# Patient Record
Sex: Male | Born: 1985 | Race: Black or African American | Hispanic: No | Marital: Single | Smoking: Current some day smoker
Health system: Southern US, Community
[De-identification: ages and names within clinical notes are randomized; demographics above are authoritative.]

## PROBLEM LIST (undated history)

## (undated) DIAGNOSIS — Z789 Other specified health status: Secondary | ICD-10-CM

## (undated) HISTORY — PX: NO PAST SURGERIES: SHX2092

## (undated) HISTORY — PX: KNEE SURGERY: SHX244

---

## 2011-07-28 ENCOUNTER — Inpatient Hospital Stay (HOSPITAL_COMMUNITY)
Admission: EM | Admit: 2011-07-28 | Discharge: 2011-07-31 | DRG: 502 | Disposition: A | Discharge status: Home / Self Care | Payer: PRIVATE HEALTH INSURANCE | Attending: Orthopedic Surgery | Admitting: Orthopedic Surgery

## 2011-07-28 HISTORY — DX: Other specified health status: Z78.9

## 2011-07-29 ENCOUNTER — Inpatient Hospital Stay: Admit: 2011-07-29 | Payer: Self-pay | Admitting: Orthopedic Surgery

## 2011-07-29 ENCOUNTER — Encounter (HOSPITAL_COMMUNITY): Payer: Self-pay | Admitting: Radiology

## 2011-07-29 ENCOUNTER — Other Ambulatory Visit (HOSPITAL_COMMUNITY): Payer: Self-pay | Admitting: Emergency Medicine

## 2011-07-29 ENCOUNTER — Inpatient Hospital Stay (HOSPITAL_COMMUNITY): Payer: PRIVATE HEALTH INSURANCE

## 2011-07-29 ENCOUNTER — Ambulatory Visit (HOSPITAL_COMMUNITY)
Admission: RE | Admit: 2011-07-29 | Discharge: 2011-07-29 | Disposition: A | Discharge status: Home / Self Care | Payer: PRIVATE HEALTH INSURANCE | Source: Ambulatory Visit | Attending: Emergency Medicine | Admitting: Emergency Medicine

## 2011-07-29 ENCOUNTER — Encounter (HOSPITAL_COMMUNITY): Payer: Self-pay | Admitting: Anesthesiology

## 2011-07-29 ENCOUNTER — Encounter (HOSPITAL_COMMUNITY)
Admission: EM | Disposition: A | Discharge status: Home / Self Care | Payer: Self-pay | Source: Home / Self Care | Attending: Orthopedic Surgery

## 2011-07-29 ENCOUNTER — Emergency Department (HOSPITAL_COMMUNITY): Payer: PRIVATE HEALTH INSURANCE | Admitting: Anesthesiology

## 2011-07-29 DIAGNOSIS — S51859A Open bite of unspecified forearm, initial encounter: Secondary | ICD-10-CM

## 2011-07-29 DIAGNOSIS — W540XXA Bitten by dog, initial encounter: Secondary | ICD-10-CM

## 2011-07-29 DIAGNOSIS — S0180XA Unspecified open wound of other part of head, initial encounter: Secondary | ICD-10-CM | POA: Diagnosis present

## 2011-07-29 DIAGNOSIS — Y998 Other external cause status: Secondary | ICD-10-CM

## 2011-07-29 DIAGNOSIS — S51809A Unspecified open wound of unspecified forearm, initial encounter: Principal | ICD-10-CM | POA: Diagnosis present

## 2011-07-29 DIAGNOSIS — F1010 Alcohol abuse, uncomplicated: Secondary | ICD-10-CM | POA: Diagnosis present

## 2011-07-29 HISTORY — PX: I & D EXTREMITY: SHX5045

## 2011-07-29 HISTORY — PX: INCISION AND DRAINAGE OF WOUND: SHX1803

## 2011-07-29 LAB — BASIC METABOLIC PANEL
BUN: 6 mg/dL (ref 6–23)
CO2: 25 mEq/L (ref 19–32)
Calcium: 9.2 mg/dL (ref 8.4–10.5)
Chloride: 103 mEq/L (ref 96–112)
Creatinine, Ser: 0.84 mg/dL (ref 0.50–1.35)
Glucose, Bld: 94 mg/dL (ref 70–99)

## 2011-07-29 LAB — CBC
HCT: 42.2 % (ref 39.0–52.0)
MCH: 31.8 pg (ref 26.0–34.0)
MCHC: 33.4 g/dL (ref 30.0–36.0)
MCV: 95 fL (ref 78.0–100.0)
Platelets: 202 10*3/uL (ref 150–400)
RDW: 13.5 % (ref 11.5–15.5)
WBC: 12.7 10*3/uL — ABNORMAL HIGH (ref 4.0–10.5)

## 2011-07-29 LAB — DIFFERENTIAL
Basophils Absolute: 0 10*3/uL (ref 0.0–0.1)
Basophils Relative: 0 % (ref 0–1)
Eosinophils Absolute: 0.1 10*3/uL (ref 0.0–0.7)
Eosinophils Relative: 1 % (ref 0–5)
Lymphocytes Relative: 24 % (ref 12–46)
Monocytes Absolute: 0.9 10*3/uL (ref 0.1–1.0)

## 2011-07-29 LAB — ETHANOL
Alcohol, Ethyl (B): 115 mg/dL — ABNORMAL HIGH (ref 0–11)
Alcohol, Ethyl (B): 15 mg/dL — ABNORMAL HIGH (ref 0–11)

## 2011-07-29 SURGERY — IRRIGATION AND DEBRIDEMENT EXTREMITY
Anesthesia: General | Site: Face | Laterality: Left | Wound class: Dirty or Infected

## 2011-07-29 MED ORDER — METHOCARBAMOL 100 MG/ML IJ SOLN
500.0000 mg | Freq: Four times a day (QID) | INTRAVENOUS | Status: DC | PRN
  Filled 2011-07-29: qty 5

## 2011-07-29 MED ORDER — GLYCOPYRROLATE 0.2 MG/ML IJ SOLN
INTRAMUSCULAR | Status: DC | PRN
  Administered 2011-07-29: .6 mg via INTRAVENOUS

## 2011-07-29 MED ORDER — VITAMIN C 500 MG PO TABS
1000.0000 mg | ORAL_TABLET | Freq: Every day | ORAL | Status: DC
  Administered 2011-07-29 – 2011-07-31 (×3): 1000 mg via ORAL
  Filled 2011-07-29 (×3): qty 2

## 2011-07-29 MED ORDER — VECURONIUM BROMIDE 10 MG IV SOLR
INTRAVENOUS | Status: DC | PRN
  Administered 2011-07-29: 4 mg via INTRAVENOUS

## 2011-07-29 MED ORDER — DOUBLE ANTIBIOTIC 500-10000 UNIT/GM EX OINT
TOPICAL_OINTMENT | CUTANEOUS | Status: AC
  Filled 2011-07-29: qty 1

## 2011-07-29 MED ORDER — HYDROMORPHONE HCL PF 1 MG/ML IJ SOLN: 0.2500 mg | INTRAMUSCULAR | Status: DC | PRN

## 2011-07-29 MED ORDER — NEOSTIGMINE METHYLSULFATE 1 MG/ML IJ SOLN
INTRAMUSCULAR | Status: DC | PRN
  Administered 2011-07-29: 5 mg via INTRAVENOUS

## 2011-07-29 MED ORDER — ONDANSETRON HCL 4 MG/2ML IJ SOLN
INTRAMUSCULAR | Status: DC | PRN
  Administered 2011-07-29: 2 mg via INTRAVENOUS
  Administered 2011-07-29: 4 mg via INTRAVENOUS

## 2011-07-29 MED ORDER — ACETAMINOPHEN 10 MG/ML IV SOLN: 1000.0000 mg | Freq: Once | INTRAVENOUS | Status: DC | PRN

## 2011-07-29 MED ORDER — KCL IN DEXTROSE-NACL 20-5-0.9 MEQ/L-%-% IV SOLN
INTRAVENOUS | Status: DC
  Administered 2011-07-29 – 2011-07-30 (×2): via INTRAVENOUS
  Filled 2011-07-29 (×9): qty 1000

## 2011-07-29 MED ORDER — DOCUSATE SODIUM 100 MG PO CAPS
100.0000 mg | ORAL_CAPSULE | Freq: Two times a day (BID) | ORAL | Status: DC
  Administered 2011-07-29 – 2011-07-31 (×4): 100 mg via ORAL
  Filled 2011-07-29 (×4): qty 1

## 2011-07-29 MED ORDER — PROPOFOL 10 MG/ML IV EMUL
INTRAVENOUS | Status: DC | PRN
  Administered 2011-07-29: 170 mg via INTRAVENOUS

## 2011-07-29 MED ORDER — DEXAMETHASONE SODIUM PHOSPHATE 4 MG/ML IJ SOLN
INTRAMUSCULAR | Status: DC | PRN
  Administered 2011-07-29: 8 mg via INTRAVENOUS

## 2011-07-29 MED ORDER — METOCLOPRAMIDE HCL 5 MG/ML IJ SOLN
INTRAMUSCULAR | Status: DC | PRN
  Administered 2011-07-29: 10 mg via INTRAVENOUS

## 2011-07-29 MED ORDER — DIPHENHYDRAMINE HCL 25 MG PO CAPS
25.0000 mg | ORAL_CAPSULE | Freq: Four times a day (QID) | ORAL | Status: DC | PRN
  Administered 2011-07-30: 25 mg via ORAL
  Filled 2011-07-29: qty 1

## 2011-07-29 MED ORDER — IOHEXOL 300 MG/ML  SOLN
100.0000 mL | Freq: Once | INTRAMUSCULAR | Status: AC | PRN
  Administered 2011-07-29: 100 mL via INTRAVENOUS

## 2011-07-29 MED ORDER — ONDANSETRON HCL 4 MG/2ML IJ SOLN: 4.0000 mg | Freq: Once | INTRAMUSCULAR | Status: DC | PRN

## 2011-07-29 MED ORDER — OXYCODONE HCL 5 MG PO TABS
5.0000 mg | ORAL_TABLET | ORAL | Status: DC | PRN
  Administered 2011-07-30 (×3): 10 mg via ORAL
  Administered 2011-07-31: 5 mg via ORAL
  Filled 2011-07-29 (×3): qty 2
  Filled 2011-07-29: qty 1

## 2011-07-29 MED ORDER — SUCCINYLCHOLINE CHLORIDE 20 MG/ML IJ SOLN
INTRAMUSCULAR | Status: DC | PRN
  Administered 2011-07-29: 140 mg via INTRAVENOUS

## 2011-07-29 MED ORDER — MORPHINE SULFATE 2 MG/ML IJ SOLN: 1.0000 mg | INTRAMUSCULAR | Status: DC | PRN

## 2011-07-29 MED ORDER — ONDANSETRON HCL 4 MG/2ML IJ SOLN: 4.0000 mg | Freq: Four times a day (QID) | INTRAMUSCULAR | Status: DC | PRN

## 2011-07-29 MED ORDER — SODIUM CHLORIDE 0.9 % IV SOLN
3.0000 g | Freq: Four times a day (QID) | INTRAVENOUS | Status: DC
  Administered 2011-07-29 – 2011-07-31 (×10): 3 g via INTRAVENOUS
  Filled 2011-07-29 (×14): qty 3

## 2011-07-29 MED ORDER — FENTANYL CITRATE 0.05 MG/ML IJ SOLN
INTRAMUSCULAR | Status: DC | PRN
  Administered 2011-07-29 (×3): 100 ug via INTRAVENOUS

## 2011-07-29 MED ORDER — ONDANSETRON HCL 4 MG PO TABS: 4.0000 mg | ORAL_TABLET | Freq: Four times a day (QID) | ORAL | Status: DC | PRN

## 2011-07-29 MED ORDER — LACTATED RINGERS IV SOLN
INTRAVENOUS | Status: DC | PRN
  Administered 2011-07-29 (×2): via INTRAVENOUS

## 2011-07-29 MED ORDER — METHOCARBAMOL 500 MG PO TABS
500.0000 mg | ORAL_TABLET | Freq: Four times a day (QID) | ORAL | Status: DC | PRN
  Administered 2011-07-30 – 2011-07-31 (×3): 500 mg via ORAL
  Filled 2011-07-29 (×4): qty 1

## 2011-07-29 MED ORDER — HYDROCODONE-ACETAMINOPHEN 5-325 MG PO TABS
1.0000 | ORAL_TABLET | ORAL | Status: DC | PRN
  Administered 2011-07-29 – 2011-07-30 (×3): 2 via ORAL
  Filled 2011-07-29 (×4): qty 2

## 2011-07-29 SURGICAL SUPPLY — 55 items
BANDAGE CONFORM 2  STR LF (GAUZE/BANDAGES/DRESSINGS) IMPLANT
BANDAGE ELASTIC 3 VELCRO ST LF (GAUZE/BANDAGES/DRESSINGS) ×3 IMPLANT
BANDAGE ELASTIC 4 VELCRO ST LF (GAUZE/BANDAGES/DRESSINGS) ×6 IMPLANT
BANDAGE GAUZE ELAST BULKY 4 IN (GAUZE/BANDAGES/DRESSINGS) ×6 IMPLANT
BNDG COHESIVE 1X5 TAN STRL LF (GAUZE/BANDAGES/DRESSINGS) IMPLANT
BNDG ESMARK 4X9 LF (GAUZE/BANDAGES/DRESSINGS) ×3 IMPLANT
CLOTH BEACON ORANGE TIMEOUT ST (SAFETY) ×3 IMPLANT
CORDS BIPOLAR (ELECTRODE) ×3 IMPLANT
COVER MAYO STAND STRL (DRAPES) ×3 IMPLANT
COVER SURGICAL LIGHT HANDLE (MISCELLANEOUS) ×3 IMPLANT
CUFF TOURNIQUET SINGLE 18IN (TOURNIQUET CUFF) ×3 IMPLANT
CUFF TOURNIQUET SINGLE 24IN (TOURNIQUET CUFF) IMPLANT
DRAIN PENROSE 1/4X12 LTX STRL (WOUND CARE) ×3 IMPLANT
DRAPE SURG 17X23 STRL (DRAPES) ×3 IMPLANT
DRSG ADAPTIC 3X8 NADH LF (GAUZE/BANDAGES/DRESSINGS) ×3 IMPLANT
DRSG PAD ABDOMINAL 8X10 ST (GAUZE/BANDAGES/DRESSINGS) ×3 IMPLANT
ELECT REM PT RETURN 9FT ADLT (ELECTROSURGICAL)
ELECTRODE REM PT RTRN 9FT ADLT (ELECTROSURGICAL) IMPLANT
GAUZE XEROFORM 1X8 LF (GAUZE/BANDAGES/DRESSINGS) ×6 IMPLANT
GAUZE XEROFORM 5X9 LF (GAUZE/BANDAGES/DRESSINGS) ×3 IMPLANT
GLOVE BIO SURGEON STRL SZ7.5 (GLOVE) ×3 IMPLANT
GLOVE BIOGEL PI IND STRL 8.5 (GLOVE) ×2 IMPLANT
GLOVE BIOGEL PI INDICATOR 8.5 (GLOVE) ×1
GLOVE SURG ORTHO 8.0 STRL STRW (GLOVE) ×3 IMPLANT
GOWN PREVENTION PLUS XLARGE (GOWN DISPOSABLE) ×3 IMPLANT
GOWN STRL NON-REIN LRG LVL3 (GOWN DISPOSABLE) ×9 IMPLANT
HANDPIECE INTERPULSE COAX TIP (DISPOSABLE) ×1
KIT BASIN OR (CUSTOM PROCEDURE TRAY) ×3 IMPLANT
KIT ROOM TURNOVER OR (KITS) ×3 IMPLANT
MANIFOLD NEPTUNE II (INSTRUMENTS) ×3 IMPLANT
NEEDLE HYPO 25GX1X1/2 BEV (NEEDLE) IMPLANT
NS IRRIG 1000ML POUR BTL (IV SOLUTION) ×3 IMPLANT
PACK ORTHO EXTREMITY (CUSTOM PROCEDURE TRAY) ×3 IMPLANT
PAD ARMBOARD 7.5X6 YLW CONV (MISCELLANEOUS) ×6 IMPLANT
PAD CAST 4YDX4 CTTN HI CHSV (CAST SUPPLIES) ×4 IMPLANT
PADDING CAST COTTON 4X4 STRL (CAST SUPPLIES) ×2
SET HNDPC FAN SPRY TIP SCT (DISPOSABLE) ×2 IMPLANT
SOAP 2 % CHG 4 OZ (WOUND CARE) ×3 IMPLANT
SPONGE GAUZE 4X4 12PLY (GAUZE/BANDAGES/DRESSINGS) ×6 IMPLANT
SPONGE LAP 18X18 X RAY DECT (DISPOSABLE) ×3 IMPLANT
SUCTION FRAZIER TIP 10 FR DISP (SUCTIONS) ×3 IMPLANT
SUT ETHILON 4 0 PS 2 18 (SUTURE) IMPLANT
SUT ETHILON 5 0 P 3 18 (SUTURE)
SUT NYLON ETHILON 5-0 P-3 1X18 (SUTURE) IMPLANT
SUT PROLENE 3 0 PS 2 (SUTURE) ×15 IMPLANT
SUT PROLENE 4 0 PS 2 18 (SUTURE) ×3 IMPLANT
SYR CONTROL 10ML LL (SYRINGE) IMPLANT
TAPE CLOTH SURG 4X10 WHT LF (GAUZE/BANDAGES/DRESSINGS) ×3 IMPLANT
TOWEL OR 17X24 6PK STRL BLUE (TOWEL DISPOSABLE) ×9 IMPLANT
TOWEL OR 17X26 10 PK STRL BLUE (TOWEL DISPOSABLE) ×3 IMPLANT
TUBE ANAEROBIC SPECIMEN COL (MISCELLANEOUS) IMPLANT
TUBE CONNECTING 12X1/4 (SUCTIONS) ×6 IMPLANT
UNDERPAD 30X30 INCONTINENT (UNDERPADS AND DIAPERS) ×3 IMPLANT
WATER STERILE IRR 1000ML POUR (IV SOLUTION) IMPLANT
YANKAUER SUCT BULB TIP NO VENT (SUCTIONS) ×3 IMPLANT

## 2011-07-29 NOTE — Op Note (Signed)
07/29/2011  6:05 AM  PATIENT:  Darin Morris  26 y.o. male  PRE-OPERATIVE DIAGNOSIS:  Multiple Dog Bites to left face  POST-OPERATIVE DIAGNOSIS:  SAME  PROCEDURE:  Procedure(s): IRRIGATION AND DEBRIDEMENT Left Face IRRIGATION AND DEBRIDEMENT WOUND ANESTHESIA:   local and general  Surgeon: Georgia Lopes, DMD  EBL:  minimal  DRAINS: 1/4" penrose left cheek  SPECIMEN:  No Specimen  COUNTS:  YES  PLAN OF CARE: Admission Dr. Melvyn Novas  PATIENT DISPOSITION:  PACU - hemodynamically stable.   PROCEDURE DETAILS: Dictation # [id number]  Georgia Lopes, DMD 07/29/2011 6:05 AM

## 2011-07-29 NOTE — ED Notes (Signed)
See downtime charting. 

## 2011-07-29 NOTE — Progress Notes (Signed)
Patient for RN to escort him outside to smoke a cigarette with girlfriend. RN states that she is unable to do so because it is against policy, but is more than willing to calling attending on call to get an order for a nicotine patch. Patient was asked earlier today if he smoked and patient denied. Patient refused for nurse to call and get an order for a nicotine patch. RN states that patient is able to walk around the unit but would not be allowed to go outside. Will continue to monitor.   Heron Nay RN BSN

## 2011-07-29 NOTE — Anesthesia Postprocedure Evaluation (Signed)
  Anesthesia Post-op Note  Patient: Darin Morris  Procedure(s) Performed: Procedure(s) (LRB): IRRIGATION AND DEBRIDEMENT EXTREMITY (Left) IRRIGATION AND DEBRIDEMENT WOUND (Left)  Patient Location: PACU  Anesthesia Type: General  Level of Consciousness: Awake, moving all extremities, uncooperative Airway and Oxygen Therapy: Patient Spontanous Breathing and Patient connected to nasal cannula oxygen  Post-op Pain: mild  Post-op Assessment: Post-op Vital signs reviewed and Patient's Cardiovascular Status Stable  Post-op Vital Signs: Reviewed and stable  Complications: No apparent anesthesia complications

## 2011-07-29 NOTE — Progress Notes (Signed)
Patient slightly agitated, stating he needs to leave the hospital and go to work.  Pt wants to contact probation officer but cannot find phone number.  Pt's place of employment notified of hospitalization per pt request.  Pt asks about belongings (clothes and wallet).  Pt arrived to unit from PACU without any belongings.  PACU and ED called, but unable to locate any belongings.  Security notified and came to speak with patient.    Discussed with patient that leaving hospital now would be against medical advise.  Educated patient on plan of care and need for IV antibiotics.  Dr. Melvyn Novas notified and spoke with patient also.  Pt is now agreeable at this point to remain in hospital.  Pt up ambulating in halls, pain controlled, tolerating diet, voiding.  Ace splint to arm intact with shadow of drainage to posterior aspect.  Dressing to face changed d/t drainage.  Sutures and penrose drain intact.

## 2011-07-29 NOTE — H&P (Signed)
Darin Morris is an 26 y.o. male.   Chief Complaint: DOG BITE TO LEFT ARM HPI: PT BROUGHT IN BY [employer] PT WITH MULTIPLE BITES TO LEFT ARM FROM PIT BULL PT DID NOT GIVE HISTORY CONSULTED BY ED FOR HIS MAULING TO LEFT ARM  No past medical history on file.  No past surgical history on file.  No family history on file. Social History:  does not have a smoking history on file. He does not have any smokeless tobacco history on file. His alcohol and drug histories not on file.  Allergies: Allergies not on file  No prescriptions prior to admission    No results found for this or any previous visit (from the past 48 hour(s)). No results found.  UNKNOWN  There were no vitals taken for this visit. General Appearance:  Alert, cooperative, no distress, appears stated age  Head:  Normocephalic, without obvious abnormality, LEFT SIDE OF FACE AND EAR WITH OPEN WOUNDS  Eyes:  Pupils equal, conjunctiva/corneas clear,         Throat: Lips, mucosa, and tongue normal; teeth and gums normal  Neck: No visible masses     Lungs:   respirations unlabored  Chest Wall:  No tenderness or deformity  Heart:  Regular rate and rhythm,  Abdomen:   Soft, non-tender,         Extremities: LEFT ARM: MULTIPLE COMPLEX LACERATIONS ON DORSORADIAL SURFACE OF ARM AND MEDIAL ELBOW REGION NEUROMOTOR SENSORY FUNCTION DIFFICULT TO ASSESS. PT COMBATIVE IN RESTRAINTS FINGERS WARM WELL PERFUSED           Neurologic: INTOXICATED    Assessment/Plan Left arm dog mauling with multiple open complex wounds  TO OR THIS AM FOR URGENT DEBRIDEMENT AND REPAIR AS INDICATED  PT SEEN IN ED PT HAS RECEIVED MORPHINE AND APPEARS INTOXICATED, SMELL OF ALCOHOL.  EMERGENT CONSENT SIGNED DAY OF SURGERY PT SEEN AND EXAMINED PRIOR TO OPERATIVE PROCEDURE/DAY OF SURGERY SITE MARKED. WILL REMAIN AN INPATIENT FOLLOWING SURGERY  Sharma Covert 07/29/2011, 4:47 AM

## 2011-07-29 NOTE — Consult Note (Signed)
Reason for Consult: dog bite to face Referring Physician: ER  Darin Morris is an 26 y.o. male.  CC:dog bite to face, left arm  HPI: Pt pre-intubation in OR for exploration/irrigation/closure left arm lacerations.   No past medical history on file.  No past surgical history on file.  No family history on file.  Social History:  does not have a smoking history on file. He does not have any smokeless tobacco history on file. His alcohol and drug histories not on file.  Allergies: Not on File  Medications: I have reviewed the patient's current medications.  No results found for this or any previous visit (from the past 48 hour(s)).  No results found.  @ROS @ There were no vitals taken for this visit. General appearance: mild distress Head: Normocephalic, without obvious abnormality, atraumatic Eyes: negative Ears: normal TM's and external ear canals both ears Nose: Nares normal. Septum midline. Mucosa normal. No drainage or sinus tenderness. Throat: lips, mucosa, and tongue normal; teeth and gums normal Neck: no adenopathy and supple, symmetrical, trachea midline Face: 2cm laceration left cheek, 2cm stellate laceration left pre-auricular area, 1cm laceration left tragus of ear, multiple lacerations/abrasions less than 1cm left face/ cheek   Assessment/Plan: Debridement/irrigation/closure dog bite lacerations to left face.  Darin Morris M 07/29/2011, 6:00 AM

## 2011-07-29 NOTE — Transfer of Care (Signed)
Immediate Anesthesia Transfer of Care Note  Patient: Darin Morris  Procedure(s) Performed: Procedure(s) (LRB): IRRIGATION AND DEBRIDEMENT EXTREMITY (Left) IRRIGATION AND DEBRIDEMENT WOUND (Left)  Patient Location: PACU  Anesthesia Type: General  Level of Consciousness: sedated and responds to stimulation  Airway & Oxygen Therapy: Patient Spontanous Breathing and Patient connected to face mask oxygen  Post-op Assessment: Report given to PACU RN, Post -op Vital signs reviewed and stable, Patient moving all extremities and Patient moving all extremities X 4  Post vital signs: Reviewed and stable  Complications: No apparent anesthesia complications

## 2011-07-29 NOTE — Anesthesia Procedure Notes (Signed)
Procedure Name: Intubation Date/Time: 07/29/2011 5:08 AM Performed by: Wray Kearns A Pre-anesthesia Checklist: Patient identified, Timeout performed, Emergency Drugs available, Suction available and Patient being monitored Patient Re-evaluated:Patient Re-evaluated prior to inductionOxygen Delivery Method: Circle system utilized Preoxygenation: Pre-oxygenation with 100% oxygen Intubation Type: IV induction, Rapid sequence and Cricoid Pressure applied Laryngoscope Size: Mac and 4 Grade View: Grade I Tube type: Oral Tube size: 8.0 mm Number of attempts: 1 Airway Equipment and Method: Stylet Placement Confirmation: ETT inserted through vocal cords under direct vision,  breath sounds checked- equal and bilateral and positive ETCO2 Secured at: 23 cm Tube secured with: Tape Dental Injury: Teeth and Oropharynx as per pre-operative assessment

## 2011-07-29 NOTE — Brief Op Note (Signed)
07/29/2011  6:28 AM  PATIENT:  Darin Morris  26 y.o. male  PRE-OPERATIVE DIAGNOSIS:  Multiple Dog Bites  POST-OPERATIVE DIAGNOSIS:  * No post-op diagnosis entered *  PROCEDURE:  Procedure(s) (LRB): IRRIGATION AND DEBRIDEMENT EXTREMITY (Left) IRRIGATION AND DEBRIDEMENT WOUND (N/A)  SURGEON:  Surgeon(s) and Role:    * Sharma Covert, MD - Primary   PHYSICIAN ASSISTANT: none  ASSISTANTS: none   ANESTHESIA:   general  EBL:  Total I/O In: 1000 [I.V.:1000] Out: -   BLOOD ADMINISTERED:none  DRAINS: none and Penrose drain in the    LOCAL MEDICATIONS USED:  NONE  SPECIMEN:  No Specimen  DISPOSITION OF SPECIMEN:  N/A  COUNTS:  YES  TOURNIQUET:  * Missing tourniquet times found for documented tourniquets in log:  40981 *  DICTATION: .Other Dictation: Dictation Number [id number]  PLAN OF CARE: Admit to inpatient   PATIENT DISPOSITION:  PACU - hemodynamically stable.   Delay start of Pharmacological VTE agent (>24hrs) due to surgical blood loss or risk of bleeding: not applicable

## 2011-07-29 NOTE — Op Note (Signed)
NAME:  [name], [name] NO.:  [id number]  MEDICAL RECORD NO.:  [id number]  LOCATION:                                 FACILITY:  PHYSICIAN:  Georgia Lopes, M.D.  DATE OF BIRTH:  Dec 27, 1985  DATE OF PROCEDURE:  07/29/2011 DATE OF DISCHARGE:                              OPERATIVE REPORT   PREOPERATIVE DIAGNOSIS:  Multiple dog bites to the left face.  POSTOPERATIVE DIAGNOSIS:  Multiple dog bites to the left face.  PROCEDURE:  Irrigation and debridement dog bite to the left face.  SURGEON:  Georgia Lopes, M.D.  ANESTHESIA:  General, Guadalupe Maple, M.D., attending.  INDICATIONS FOR PROCEDURE:  Darin Morris is a 26 year old black male who was seen by me in the operating room immediately prior to intubation for procedure by Dr. Bradly Bienenstock to irrigate and debride the left extremity which had been severely injured during dog bite attack.  The patient also received bites to the left face.  Plan was to irrigate and debride the areas and sutures were necessary.  PROCEDURE:  The patient was placed on the table in supine position. General anesthesia was administered, and an oral endotracheal tube was placed and secured.  The eyes were protected.  The patient was draped for the debridement and irrigation by Dr. Melvyn Novas, then the drapes were rearranged so that access to the face could be obtained and the patient was rotated slightly to the right.  The patient was then draped for oral surgery.  The wound was inspected and palpated after being prepped and draped.  There were no internal foreign bodies that could be noted. Inside the lacerations, there was a 2 cm laceration medially overlying the parotid inferior to the left cheek that was the deepest of the wounds.  There was also a 2-4 cm stellate laceration in the preauricular area with the mobile tissue flap.  There were several small lacerations less than 1 cm over the left cheek and 1 cm laceration in the  tragal area of the ear without cartilage exposed.  These areas were all irrigated with 3 L normal saline cysto solution, then a quarter-inch Penrose drain was placed into the deepest of the bite injuries and sutured with 4-0 Prolene.  The remainder of the bite was closed with 4-0 Prolene to tack the areas somewhat loosely, but to reapproximate the tissue appropriately and then the wound was covered with bacitracin 4x4s and tape.  The patient remained intubated for a continuation of irrigation and debridement Dr. Melvyn Novas.     Georgia Lopes, M.D.    SMJ/MEDQ  D:  07/29/2011  T:  07/29/2011  Job:  [id number]

## 2011-07-29 NOTE — Anesthesia Preprocedure Evaluation (Signed)
Anesthesia Evaluation  Patient identified by MRN, date of birth, ID band Patient awake    Reviewed: Allergy & Precautions, H&P , NPO status , Patient's Chart, lab work & pertinent test results  Airway     Mouth opening: Limited Mouth Opening  Dental  (+) Teeth Intact   Pulmonary  breath sounds clear to auscultation        Cardiovascular Rhythm:Regular Rate:Normal     Neuro/Psych    GI/Hepatic   Endo/Other    Renal/GU      Musculoskeletal   Abdominal   Peds  Hematology   Anesthesia Other Findings Pt. uncooperative in 4 point restraints. Unable to obtain medical history from the patient.  Reproductive/Obstetrics                           Anesthesia Physical Anesthesia Plan  ASA: II and Emergent  Anesthesia Plan: General   Post-op Pain Management:    Induction: Intravenous, Cricoid pressure planned and Rapid sequence  Airway Management Planned: Oral ETT  Additional Equipment:   Intra-op Plan:   Post-operative Plan: Extubation in OR  Informed Consent: I have reviewed the patients History and Physical, chart, labs and discussed the procedure including the risks, benefits and alternatives for the proposed anesthesia with the patient or authorized representative who has indicated his/her understanding and acceptance.     Plan Discussed with: CRNA and Surgeon  Anesthesia Plan Comments:         Anesthesia Quick Evaluation

## 2011-07-29 NOTE — Op Note (Signed)
NAME:  Darin Morris, Darin Morris         ACCOUNT NO.:  [id number]  MEDICAL RECORD NO.:  [id number]  LOCATION:                                 FACILITY:  PHYSICIAN:  Madelynn Done, MD  DATE OF BIRTH:  1985/07/27  DATE OF PROCEDURE:  07/29/2011 DATE OF DISCHARGE:                              OPERATIVE REPORT   PREOPERATIVE DIAGNOSES:  Multiple dog bites, mangling injury to the left forearm and elbow from a pit bull attack.  POSTOPERATIVE DIAGNOSES:  Multiple dog bites, mangling injury to the left forearm and elbow from a pit bull attack.  ANESTHESIA:  General via endotracheal tube.  SURGICAL PROCEDURES: 1. Left forearm irrigation and debridement, 5-cm dog bite excisional     debridement over the dorsal radial aspect of the forearm. 2. Debridement of skin, subcutaneous tissue, and muscle, 6-cm     laceration over the dorsal aspect of the forearm. 3. Debridement of skin, subcutaneous tissue, muscle, and tendon, 15-cm     wound medial aspect of the forearm and elbow. 4. Median nerve neurolysis and exploration. 5. Debridement of the flexor pronator mass fascia as well as partial     tendon of the distal biceps. 6. Traumatic laceration repair 5 cm 7. Traumatic laceration repair 6 cm 8. Traumatic laceration repair 15 cm  SURGICAL INDICATIONS:  Mr. Labrie is a right-hand-dominant gentleman, who sustained a pit bull attack.  The patient was seen in the emergency room and given the extensive injuries, it was recommended that he undergo the above procedure.  The procedure was discussed with the patient, and emergent consent was obtained.  The patient had been sedated, appeared intoxicated, and it was felt necessary given the extensive wound to take him on an emergent basis.  DESCRIPTION OF PROCEDURE:  The patient was properly identified in the preop holding area and a mark with a permanent marker was made on the left arm to indicate the correct operative site.  The patient was  then brought back to the operating room and placed supine on the anesthesia room table.  General anesthesia was administered.  The patient received Zosyn.  The well-padded tourniquet was then placed on the left brachium and sealed with a 1000 drape.  Left upper extremity was then prepped and draped in normal sterile fashion.  Time-out was called, correct site was identified, and the procedure was then begun.  Attention was then turned to the left arm where the patient did have a 15-cm laceration, a 5-cm laceration, and a 6-cm laceration.  All these were then separately debrided.  Excisional debridement was then carried out of the skin, subcutaneous tissue, and muscle with sharp knives and scissors and after excisional debridement and removing the devitalized tissue, the medial wound was focused on.  This underwent excisional debridement.  The excision of the muscle and tendon of the flexor pronator mass was then carried out as well as portion of the biceps tendon.  The brachial artery and median nerve were then carefully explored.  Careful x-rays of the brachial artery revealed the artery to be intact as well as the median nerve.  Copious wound irrigation done throughout.  Following this, after median nerve neurolysis, the wounds were then irrigated  once again.  Following this, the traumatic lacerations were then loosely reapproximated with 3-0 Prolene sutures.  Partial closure of the wounds was then carried out to allow the wounds to drain.  The Adaptic dressings were then applied.  Sterile compressive bandage was then applied.  The patient was then placed in Webril, Kerlix, and a long arm splint.  The patient had good refill and good blood flow to the hand.  The patient was then extubated and taken to recovery room in good condition.  POSTPROCEDURE PLAN:  The patient will be admitted to the orthopedic floor for IV antibiotics and pain control.  The wounds will be examined and may  likely require repeat I and D at the 48-hour postop mark.     Madelynn Done, MD     FWO/MEDQ  D:  07/29/2011  T:  07/29/2011  Job:  [id number]

## 2011-07-30 ENCOUNTER — Encounter (HOSPITAL_COMMUNITY): Payer: Self-pay | Admitting: Orthopedic Surgery

## 2011-07-30 MED FILL — Midazolam HCl Inj 2 MG/2ML (Base Equivalent): INTRAMUSCULAR | Qty: 4 | Status: AC

## 2011-07-30 MED FILL — Piperacillin Sod-Tazobactam Na For Inj 3.375 GM (3-0.375 GM): INTRAVENOUS | Qty: 3.38 | Status: AC

## 2011-07-30 MED FILL — Morphine Sulfate IV Soln 25 MG/ML: INTRAVENOUS | Qty: 10 | Status: AC

## 2011-07-30 MED FILL — Ondansetron HCl Inj 4 MG/2ML (2 MG/ML): INTRAMUSCULAR | Qty: 2 | Status: AC

## 2011-07-30 NOTE — Progress Notes (Signed)
Physical Therapy Screen  Orders received, chart reviewed, spoke with RN;   Pt ambulating in hallways independently;   No PT needs identified -- Will defer to OT for therapy needs;  Signing off, Thanks,  [place], [place] [phone]

## 2011-07-30 NOTE — Evaluation (Signed)
Occupational Therapy Evaluation Patient Details Name: Darin Morris MRN: [id number] DOB: 08-Oct-1985 Today's Date: 07/30/2011 Time: 2595-6387 OT Time Calculation (min): 12 min  OT Assessment / Plan / Recommendation Clinical Impression  This 26 year old man sustained LUE and facial injuries from a pitbull. He needs min A for UB ADLs due to LUE fully extended in splint.  He is independent with all other adls and verbalizes understanding of edema management.  No further OT needs nor DME are identified at this time.      OT Assessment  Patient does not need any further OT services    Follow Up Recommendations  No OT follow up    Barriers to Discharge      Equipment Recommendations  None recommended by OT    Recommendations for Other Services    Frequency       Precautions / Restrictions Precautions Precautions:  (elevate for edema) Restrictions Other Position/Activity Restrictions: I & D to LUE--elevate   Pertinent Vitals/Pain No pain reported   ADL  Upper Body Bathing: Simulated;Minimal assistance Where Assessed - Upper Body Bathing: Unsupported sitting Lower Body Bathing: Independent;Simulated Where Assessed - Lower Body Bathing: Unsupported sit to stand Upper Body Dressing: Performed;Minimal assistance Where Assessed - Upper Body Dressing: Unsupported sitting Lower Body Dressing: Performed;Independent Where Assessed - Lower Body Dressing: Unsupported sit to stand Toilet Transfer: Simulated;Independent Toileting - Clothing Manipulation and Hygiene: Simulated;Independent Transfers/Ambulation Related to ADLs: pt walking around room independently ADL Comments: Pt needs min A with UB ADLS:  LUE is splinted in extension.  Educated on modifications as well as edema managment.  Girlfriend in room and will help pt.      OT Diagnosis:    OT Problem List:   OT Treatment Interventions:     OT Goals    Visit Information  Last OT Received On: 07/30/11    Subjective Data  Subjective: "Can I wear jeans and a tshirt"   Prior Functioning  Vision/Perception  Home Living Additional Comments: girlfriend in room and will help prn Prior Function Level of Independence: Independent      Cognition  Overall Cognitive Status: Appears within functional limits for tasks assessed/performed Behavior During Session: [facility] for tasks performed    Extremity/Trunk Assessment     Mobility     Exercise    Balance    End of Session OT - End of Session Activity Tolerance: Patient tolerated treatment well Patient left: with family/visitor present;with call bell/phone within reach (eob)  GO     [name] 07/30/2011, 12:28 PM Marica Otter, OTR/L [phone] 07/30/2011

## 2011-07-30 NOTE — Progress Notes (Signed)
Met with patient and his girlfriend Darin Morris this morning. Patient was very upset about multiple issues and CSW provided reassurance and support.  Patient is very anxious to talk to his surgeon about his condition, surgery and possible need for further surgery.  Patient states that he is very anxious and had nursing concerns which have been addressed by the assistant unit director. Patient states that he lives in [place] with his girlfriend Darin Morris. He works for [employer] and will start having benefits in August. He is very worried about his job.  He is also in probation and requested that CSW notify his probation officer of his admission as "proof" of his hospitalization. He also attends a program called "[employer]" and he is upset that he missed his group on Monday and will miss Wednesday's meeting.  Per his request- CSW left a message for [name]- Coordinator  [phone]  Ext [phone]  regarding patient being in the hospital. This helped to reassure patient somewhat.  Nursing is aware of above and are attempting to reach the surgeon or PA regarding patient's concerns.  No further CSW need identified.  CSW will sign off but will be available if needed.  [name]. [place]  [phone]

## 2011-07-30 NOTE — Progress Notes (Signed)
POD #1  PT SEEN EXAMINED PT REASSURED ABOUT FINDINGS OF ARM PLAN TO LOOK AT WOUNDS TOMORROW IF LOOKS OK COULD GO HOME PT MAY NEED REPEAT I/D PT VOICED UNDERSTANDING OF PLAN

## 2011-07-31 LAB — POCT I-STAT 4, (NA,K, GLUC, HGB,HCT)
HCT: 42 % (ref 39.0–52.0)
Potassium: 3.8 mEq/L (ref 3.5–5.1)
Sodium: 140 mEq/L (ref 135–145)

## 2011-07-31 MED ORDER — BACITRACIN-NEOMYCIN-POLYMYXIN OINTMENT TUBE
TOPICAL_OINTMENT | Freq: Every day | CUTANEOUS | Status: DC
  Filled 2011-07-31: qty 15

## 2011-07-31 MED ORDER — OXYCODONE-ACETAMINOPHEN 10-325 MG PO TABS: 1.0000 | ORAL_TABLET | ORAL | Status: AC | PRN

## 2011-07-31 MED ORDER — AMOXICILLIN-POT CLAVULANATE 875-125 MG PO TABS: 1.0000 | ORAL_TABLET | Freq: Two times a day (BID) | ORAL | Status: AC

## 2011-07-31 NOTE — Progress Notes (Signed)
Dr. Orlan Leavens aware that pt did not receive rabies vaccine in ED stated he was not going to order one it was between pharmacy and the ED and that the pt needed to go back to ED for vaccine.

## 2011-07-31 NOTE — Progress Notes (Signed)
Utilization review completed. Anette Guarneri, RN, BSN.  07/30/11

## 2011-07-31 NOTE — Discharge Summary (Signed)
Physician Discharge Summary  Patient ID: Darin Morris MRN: [id number] DOB/AGE: 24-02-1985 24 y.o.  Admit date: 07/29/2011 Discharge date: 07/31/2011  Admission Diagnoses: Multiple Dog Bites Past Medical History  Diagnosis Date  . No pertinent past medical history     Discharge Diagnoses:  Dog bite left arm  Surgeries: Procedure(s): IRRIGATION AND DEBRIDEMENT EXTREMITY IRRIGATION AND DEBRIDEMENT WOUND on 07/29/2011    Consultants:  none  Discharged Condition: Improved  Hospital Course: Darin Morris is an 26 y.o. male who was admitted 07/29/2011 with a chief complaint of No chief complaint on file. , and found to have a diagnosis of Multiple Dog Bites.  They were brought to the operating room on 07/29/2011 and underwent Procedure(s): IRRIGATION AND DEBRIDEMENT EXTREMITY IRRIGATION AND DEBRIDEMENT WOUND.    They were given perioperative antibiotics: Anti-infectives     Start     Dose/Rate Route Frequency Ordered Stop   07/31/11 0000   amoxicillin-clavulanate (AUGMENTIN) 875-125 MG per tablet        1 tablet Oral 2 times daily 07/31/11 1719 08/10/11 2359   07/29/11 0900   Ampicillin-Sulbactam (UNASYN) 3 g in sodium chloride 0.9 % 100 mL IVPB        3 g 100 mL/hr over 60 Minutes Intravenous Every 6 hours 07/29/11 0735          .  They were given sequential compression devices, early ambulation, and Other (comment) AMBULATION for DVT prophylaxis.  Recent vital signs: Patient Vitals for the past 24 hrs:  BP Temp Temp src Pulse Resp SpO2  07/31/11 0546 121/70 mmHg 98.9 F (37.2 C) Oral 59  18  100 %  07/30/11 2320 131/65 mmHg 99.3 F (37.4 C) - 74  - 100 %  .  Recent laboratory studies: No results found.  Discharge Medications:   Medication List  As of 07/31/2011  5:20 PM   TAKE these medications         amoxicillin-clavulanate 875-125 MG per tablet   Commonly known as: AUGMENTIN   Take 1 tablet by mouth 2 (two) times daily.     oxyCODONE-acetaminophen 10-325 MG per tablet   Commonly known as: PERCOCET   Take 1 tablet by mouth every 4 (four) hours as needed for pain.            Diagnostic Studies: Dg Elbow 2 Views Left  07/29/2011  *RADIOLOGY REPORT*  Clinical Data: Dog bite and soft tissue injury to the left elbow region.  LEFT ELBOW - TWO VIEWS  Comparison:  None.  Findings: Extensive soft tissue injuries are identified centered around the left elbow with visible soft tissue gas present dissecting in the fascial planes of the upper arm.  No underlying fracture or dislocation. Two tiny densities are seen along lateral and medial aspects of the proximal forearm which may be within the wounds or on the skin.  IMPRESSION: No acute fracture.  Extensive soft tissue injuries with soft tissue gas present around the elbow and extending superiorly into the upper arm.  Original Report Authenticated By: Reola Calkins, M.D.   Ct Soft Tissue Neck W Contrast  07/29/2011  *RADIOLOGY REPORT*  Clinical Data:  DOG BITE  CT NECK WITH CONTRAST  Technique:  Multidetector CT imaging of the neck was performed using the standard protocol following the bolus administration of intravenous contrast.  Contrast: OMNIPAQUE IOHEXOL 300 MG/ML  SOLN 100 ml Omnipaque  Comparison:   None  Comparison:   None.  Findings:  There is soft tissue injury to  the left neck in the region of the angle of the jaw.  There is gas dissecting through the muscles of mastication from the level hyoid bone superior to the parotid gland.  There is swelling of the muscles of mastication.  There is no evidence of discrete vascular injury. The submandibular glands and parotid glands are intact.  There is some disruption of the inferior tail of the parotid gland on the left.  There is a deep, calcific density measuring 3 mm at the most medial inferior aspect of the injury (image 64).  This is in the course of the styloid process on the left.  Cannot exclude a foreign body  from the injury.  This is essentially within the parapharyngeal space.  There is no discrete evidence of vascular injury.  Airway is intact.  The glottis is normal.  Exam extends to the thoracic inlet.  There is no evidence of pneumothorax or vascular injury to the thoracic inlet.  IMPRESSION:    1.  Extensive soft tissue injury to the left muscles of mastication and parotid gland on the left.  There is gas dissecting along the planes of the injury.  2.  Injury to the tail the parotid gland.  3.  Question small foreign body in the parapharyngeal space inferior to the styloid process of the left.  4.  No clear evidence of arterial vascular injury.  Original Report Authenticated By: Genevive Bi, M.D.   Ct Maxillofacial Wo Cm  07/29/2011  *RADIOLOGY REPORT*  Clinical Data:  Dog bite  CT MAXILLOFACIAL WITH CONTRAST  Technique:  Multidetector CT imaging of the maxillofacial structure s was performed with intravenous contrast. Multiplanar CT image reconstructions were als o generated.  A small metallic BB was placed on the right temple in order to reliably differentiate right from left.  Contrast: OMNIPAQUE IOHEXOL 300 MG/ML  SOLN 100 ml Omnipaque  Comparison:   None.  Findings:  There is soft tissue injury to the left neck in the region of the angle of the jaw.  There is gas dissecting through the muscles of mastication from the level hyoid bone superior to the parotid gland.  There is swelling of the muscles of mastication.  There is no evidence of discrete vascular injury. The submandibular glands and parotid glands are intact.  There is some disruption of the inferior tail of the parotid gland on the left.  There is a deep, calcific density measuring 3 mm at the most medial inferior aspect of the injury (image 64).  This is in the course of the styloid process on the left.  Cannot exclude a foreign body from the injury.  This is essentially within the parapharyngeal space.  There is no discrete evidence  of vascular injury.  Airway is intact.  The glottis is normal.  Exam extends to the thoracic inlet.  There is no evidence of pneumothorax or vascular injury to the thoracic inlet.  IMPRESSION:  1.  Extensive soft tissue injury to the left muscles of mastication and parotid gland on the left.  There is gas dissecting along the planes of the injury. 2.  Injury to the tail of the parotid gland. 3.  Question small foreign body in the parapharyngeal space inferior to the styloid process of the left.  4.  No clear evidence of arterial vascular injury.  Original Report Authenticated By: Genevive Bi, M.D.    They benefited maximally from their hospital stay and there were no complications.     Disposition:  Final discharge disposition not confirmed  Follow-up Information    Follow up with Sharma Covert, MD. Schedule an appointment as soon as possible for a visit in 5 days.   Contact information:   [facility] [address] [place] [address] [phone]        PT SEEN/EXAMINED ON DAY OF DISCHARGE WOUNDS LOOKED GOOD FELT READY TO GO HOME CLOSE F/U IN FIVE DAYS .   SignedSharma Covert 07/31/2011, 5:20 PM

## 2011-08-06 LAB — POCT I-STAT, CHEM 8
BUN: 20 mg/dL (ref 6–23)
Creatinine, Ser: 1 mg/dL (ref 0.50–1.35)
Glucose, Bld: 129 mg/dL — ABNORMAL HIGH (ref 70–99)
Sodium: 140 mEq/L (ref 135–145)
TCO2: 20 mmol/L (ref 0–100)

## 2012-06-18 ENCOUNTER — Encounter (HOSPITAL_COMMUNITY): Payer: Self-pay

## 2012-06-18 ENCOUNTER — Emergency Department (INDEPENDENT_AMBULATORY_CARE_PROVIDER_SITE_OTHER)
Admission: EM | Admit: 2012-06-18 | Discharge: 2012-06-18 | Disposition: A | Discharge status: Home / Self Care | Payer: Self-pay | Source: Home / Self Care

## 2012-06-18 DIAGNOSIS — S0181XA Laceration without foreign body of other part of head, initial encounter: Secondary | ICD-10-CM

## 2012-06-18 DIAGNOSIS — S0180XA Unspecified open wound of other part of head, initial encounter: Secondary | ICD-10-CM

## 2012-06-18 DIAGNOSIS — Z23 Encounter for immunization: Secondary | ICD-10-CM

## 2012-06-18 MED ORDER — TETANUS-DIPHTH-ACELL PERTUSSIS 5-2.5-18.5 LF-MCG/0.5 IM SUSP
INTRAMUSCULAR | Status: AC
  Filled 2012-06-18: qty 0.5

## 2012-06-18 MED ORDER — TETANUS-DIPHTH-ACELL PERTUSSIS 5-2.5-18.5 LF-MCG/0.5 IM SUSP
0.5000 mL | Freq: Once | INTRAMUSCULAR | Status: AC
  Administered 2012-06-18: 0.5 mL via INTRAMUSCULAR

## 2012-06-18 NOTE — ED Provider Notes (Signed)
History     CSN: [id number]  Arrival date & time 06/18/12  1023   First MD Initiated Contact with Patient 06/18/12 1142      Chief Complaint  Patient presents with  . Fall    (Consider location/radiation/quality/duration/timing/severity/associated sxs/prior treatment) HPI Comments: 27 year old male was at home last night and this morning and awake very late including early this morning. He was chasing the cat, tripped, fell and struck his right side of his fore head on the corner of the table. He is unsure as to what time that was. He states he remembers the fall and did not lose consciousness. Shortly after he went to bed and fell asleep. He awoke this morning and noticed the laceration that he applied a bandage to previously. He presents today with a 4 cm deep gaping laceration over the right eyebrow and glabella. He denies any known loss of consciousness. He remembers the events and denies other injury. Denies injury to the neck, back, chest, abdomen or other extremities. His speech is clear, he is alert and oriented moves all extremities and denies focal paresthesias or weakness. He had been consuming alcohol last night. His only complaint is that he feels sleepy in part because he had been drinking last night and that he did not get much sleep because he was awake to light.   Past Medical History  Diagnosis Date  . No pertinent past medical history     Past Surgical History  Procedure Laterality Date  . No past surgeries    . I&d extremity  07/29/2011    Procedure: IRRIGATION AND DEBRIDEMENT EXTREMITY;  Surgeon: Sharma Covert, MD;  Location: [facility] OR;  Service: Orthopedics;  Laterality: Left;  . Incision and drainage of wound  07/29/2011    Procedure: IRRIGATION AND DEBRIDEMENT WOUND;  Surgeon: Sharma Covert, MD;  Location: [facility] OR;  Service: Orthopedics;  Laterality: Left;    History reviewed. No pertinent family history.  History  Substance Use Topics  . Smoking status: Former  Smoker -- 4 years    Types: Cigarettes    Quit date: 04/08/2011  . Smokeless tobacco: Never Used  . Alcohol Use: 6.0 oz/week    6 Cans of beer, 4 Shots of liquor per week      Review of Systems  Constitutional: Negative.  Negative for fever, diaphoresis, appetite change and fatigue.  HENT: Negative for ear pain, congestion, sore throat, facial swelling, trouble swallowing, neck pain, neck stiffness, dental problem and ear discharge.   Respiratory: Negative.  Negative for cough and shortness of breath.   Cardiovascular: Negative for chest pain and leg swelling.  Gastrointestinal: Negative.   Genitourinary: Negative.   Musculoskeletal: Negative for myalgias, back pain, joint swelling and gait problem.  Skin: Positive for wound.  Neurological: Negative for dizziness, weakness, numbness and headaches.  Psychiatric/Behavioral: Negative for suicidal ideas, behavioral problems, decreased concentration and agitation. The patient is not hyperactive.     Allergies  Review of patient's allergies indicates no known allergies.  Home Medications  No current outpatient prescriptions on file.  BP 130/66  Pulse 58  Temp(Src) 98.4 F (36.9 C) (Oral)  Resp 16  SpO2 100%  Physical Exam  Nursing note and vitals reviewed. Constitutional: He is oriented to person, place, and time. He appears well-developed and well-nourished. No distress.  HENT:  Bilateral TMs are normal without hemotympanum Oropharynx is clear without swelling or evidence of trauma.  Eyes: Conjunctivae and EOM are normal. Pupils are equal, round, and  reactive to light.  Neck: Normal range of motion. Neck supple.  Cardiovascular: Normal rate, regular rhythm and normal heart sounds.   Pulmonary/Chest: Breath sounds normal. No respiratory distress.  Musculoskeletal: Normal range of motion. He exhibits no edema and no tenderness.  Lymphadenopathy:    He has no cervical adenopathy.  Neurological: He is alert and oriented to  person, place, and time. No cranial nerve deficit. He exhibits normal muscle tone.  Skin: Skin is warm and dry.  There is an approximately 4 cm laceration across the right forehead at the level of the right brow extending into the glabella. It does not involve the nose or the eyelid.    ED Course  LACERATION REPAIR Date/Time: 06/18/2012 1:00 PM Performed by: Phineas Real, Khayri Kargbo Authorized by: Maryelizabeth Rowan Consent: Verbal consent obtained. Risks and benefits: risks, benefits and alternatives were discussed Consent given by: patient Patient understanding: patient states understanding of the procedure being performed Patient identity confirmed: verbally with patient Body area: head/neck Location details: right eyebrow Laceration length: 4 cm Tendon involvement: none Nerve involvement: none Vascular damage: no Anesthesia: local infiltration Local anesthetic: lidocaine 2% with epinephrine Anesthetic total: 4 ml Patient sedated: no Preparation: Patient was prepped and draped in the usual sterile fashion. Irrigation solution: saline Irrigation method: syringe Amount of cleaning: standard Debridement: none Degree of undermining: none Skin closure: 4-0 nylon and 5-0 nylon Number of sutures: 8 Technique: simple Approximation: close Approximation difficulty: simple Dressing: antibiotic ointment Comments: Suture needle placed deep to close the SQ layer as well as the dermis   (including critical care time)  Labs Reviewed - No data to display No results found.   1. Laceration of forehead without complication, initial encounter       MDM  1145H pt in bathroom 1154h  Pt in bathroom Wound closure with sutures. Injury instructions were given in detail verbally and written. He will be going home with a friend and will stay with a friend all day. He will be out of work today. He will be given a TDap if cannot specifically remember a tetanus injection in the past 5 years. Suture wound  care instructions given. He should return in 6 days to have the sutures removed. For any new symptoms, problems, worsening, signs of head injury or other problems her to the emergency department. Otherwise return to the urgent care.        Hayden Rasmussen, NP 06/18/12 1333

## 2012-06-18 NOTE — ED Notes (Signed)
Set up suture tray for provider

## 2012-06-18 NOTE — ED Notes (Signed)
Presents w laceration to right eyebrow area, patient unsure cause, but admits to consuming ETOH last PM. Unsure of LOC . Laceration deep, aprox 3-4 cm, bleeding controlled. Denies other injury C/o HA

## 2012-06-26 ENCOUNTER — Emergency Department (INDEPENDENT_AMBULATORY_CARE_PROVIDER_SITE_OTHER)
Admission: EM | Admit: 2012-06-26 | Discharge: 2012-06-26 | Disposition: A | Discharge status: Home / Self Care | Payer: Self-pay | Source: Home / Self Care

## 2012-06-26 ENCOUNTER — Encounter (HOSPITAL_COMMUNITY): Payer: Self-pay | Admitting: *Deleted

## 2012-06-26 DIAGNOSIS — Z4802 Encounter for removal of sutures: Secondary | ICD-10-CM

## 2012-06-26 NOTE — ED Provider Notes (Signed)
History     CSN: [id number]  Arrival date & time 06/26/12  1231   None     Chief Complaint  Patient presents with  . Suture / Staple Removal    (Consider location/radiation/quality/duration/timing/severity/associated sxs/prior treatment) HPI Comments: For suture removal of laceration over R brow and glabella   Past Medical History  Diagnosis Date  . No pertinent past medical history     Past Surgical History  Procedure Laterality Date  . No past surgeries    . I&d extremity  07/29/2011    Procedure: IRRIGATION AND DEBRIDEMENT EXTREMITY;  Surgeon: Sharma Covert, MD;  Location: [facility] OR;  Service: Orthopedics;  Laterality: Left;  . Incision and drainage of wound  07/29/2011    Procedure: IRRIGATION AND DEBRIDEMENT WOUND;  Surgeon: Sharma Covert, MD;  Location: [facility] OR;  Service: Orthopedics;  Laterality: Left;    No family history on file.  History  Substance Use Topics  . Smoking status: Former Smoker -- 4 years    Types: Cigarettes    Quit date: 04/08/2011  . Smokeless tobacco: Never Used  . Alcohol Use: 6.0 oz/week    6 Cans of beer, 4 Shots of liquor per week      Review of Systems  All other systems reviewed and are negative.    Allergies  Review of patient's allergies indicates no known allergies.  Home Medications  No current outpatient prescriptions on file.  BP 120/70  Pulse 72  Temp(Src) 98.6 F (37 C) (Oral)  Resp 16  SpO2 100%  Physical Exam  Constitutional: He is oriented to person, place, and time.  Pulmonary/Chest: Effort normal.  Neurological: He is alert and oriented to person, place, and time.  Skin: Skin is warm and dry. No rash noted.  Wound healing well, no signs of infection.  Psychiatric: He has a normal mood and affect.    ED Course  Procedures (including critical care time)  Labs Reviewed - No data to display No results found.   1. Visit for suture removal       MDM  Keep wound dry, watch for infection Scar  minimization instructions All sutures removed        Hayden Rasmussen, NP 06/26/12 1301

## 2012-06-26 NOTE — ED Notes (Signed)
Pt  Here  For  Suture  Removal   Sutures  In place  For  8  Days

## 2012-06-26 NOTE — ED Provider Notes (Signed)
Medical screening examination/treatment/procedure(s) were performed by non-physician practitioner and as supervising physician I was immediately available for consultation/collaboration.   MORENO-COLL,Hania Cerone; MD  Drayson Dorko Moreno-Coll, MD 06/26/12 1624 

## 2012-07-31 ENCOUNTER — Emergency Department (HOSPITAL_COMMUNITY): Payer: Medicaid - Out of State

## 2012-07-31 ENCOUNTER — Encounter (HOSPITAL_COMMUNITY): Payer: Self-pay

## 2012-07-31 ENCOUNTER — Emergency Department (HOSPITAL_COMMUNITY)
Admission: EM | Admit: 2012-07-31 | Discharge: 2012-07-31 | Discharge status: Against Medical Advice | Payer: Medicaid - Out of State | Source: Home / Self Care | Attending: Emergency Medicine | Admitting: Emergency Medicine

## 2012-07-31 ENCOUNTER — Emergency Department (HOSPITAL_COMMUNITY)
Admission: EM | Admit: 2012-07-31 | Discharge: 2012-07-31 | Disposition: A | Discharge status: Home / Self Care | Payer: Medicaid - Out of State | Attending: Emergency Medicine | Admitting: Emergency Medicine

## 2012-07-31 DIAGNOSIS — Y929 Unspecified place or not applicable: Secondary | ICD-10-CM | POA: Insufficient documentation

## 2012-07-31 DIAGNOSIS — Y9389 Activity, other specified: Secondary | ICD-10-CM | POA: Insufficient documentation

## 2012-07-31 DIAGNOSIS — R5381 Other malaise: Secondary | ICD-10-CM | POA: Insufficient documentation

## 2012-07-31 DIAGNOSIS — Z87891 Personal history of nicotine dependence: Secondary | ICD-10-CM | POA: Insufficient documentation

## 2012-07-31 DIAGNOSIS — IMO0002 Reserved for concepts with insufficient information to code with codable children: Secondary | ICD-10-CM

## 2012-07-31 DIAGNOSIS — S61409A Unspecified open wound of unspecified hand, initial encounter: Secondary | ICD-10-CM | POA: Insufficient documentation

## 2012-07-31 DIAGNOSIS — R209 Unspecified disturbances of skin sensation: Secondary | ICD-10-CM | POA: Insufficient documentation

## 2012-07-31 DIAGNOSIS — S61411A Laceration without foreign body of right hand, initial encounter: Secondary | ICD-10-CM

## 2012-07-31 DIAGNOSIS — W172XXA Fall into hole, initial encounter: Secondary | ICD-10-CM | POA: Insufficient documentation

## 2012-07-31 DIAGNOSIS — W268XXA Contact with other sharp object(s), not elsewhere classified, initial encounter: Secondary | ICD-10-CM | POA: Insufficient documentation

## 2012-07-31 MED ORDER — HYDROCODONE-ACETAMINOPHEN 5-325 MG PO TABS: 2.0000 | ORAL_TABLET | Freq: Four times a day (QID) | ORAL | Status: DC | PRN

## 2012-07-31 MED ORDER — PROMETHAZINE HCL 25 MG PO TABS: 25.0000 mg | ORAL_TABLET | Freq: Four times a day (QID) | ORAL | Status: DC | PRN

## 2012-07-31 NOTE — ED Notes (Signed)
Pt came out of room yelling at RN that he needed something to drink- pt told that he could have something as soon as the doctor assessed him.  Pt became loud and cursing at desk - [employer] at nurses station.  When radiology transport sent for pt- pt became upset and states "yall are all stuck up".  Pt escorted back into room by [employer].

## 2012-07-31 NOTE — ED Notes (Signed)
Pt became belligerent with staff and started cursing and yelling "fuck everybody in here".  Pt escorted off by [employer].  Pt left ama and refused care.

## 2012-07-31 NOTE — ED Provider Notes (Signed)
Had briefly gone into room to check patient w laceration to palmar aspect right hand, dressing intact, no active bleeding. xrays pending.  Discussed plan w patient or xr,  Complete  exam, possible hand consult.  Went to recheck, staff indicates patient had left ED AMA prior to completion of his evaluation and treatment.   Suzi Roots, MD 07/31/12 [phone]

## 2012-07-31 NOTE — ED Provider Notes (Signed)
CSN: [id number]     Arrival date & time 07/31/12  1440 History    This chart was scribed for a non-physician practitioner, Roxy Horseman, PA-C, working with Rolan Bucco, MD by Frederik Pear, ED Scribe. This patient was seen in room WTR8/WTR8 and the patient's care was started at 1503.   First MD Initiated Contact with Patient 07/31/12 1503     Chief Complaint  Patient presents with  . Laceration   (Consider location/radiation/quality/duration/timing/severity/associated sxs/prior Treatment) The history is provided by the patient and medical records. No language interpreter was used.    HPI Comments: Darin Morris is a 27 y.o. male brought in by ambulance who presents to the Emergency Department complaining of a left hand laceration with associated numbness in his third, fourth, and fifth digits of the left hand that began 1 hour ago after he fell on an L-shaped piece of metal while attempting to fix his air conditioner. His significant other reports he seems more disoriented than usual in the ED and is concerned it is due to the amount of blood that he initially lost. She reports immediately after the accident that the wound was spraying and gushing blood which was alleviated by reducing the amount of movement he was making. She quantified the amount of blood lost as a "pint." He denies treatment at home, but EMS applied a bandage in route to the ED. His last tetanus was 2 months ago.    Past Medical History  Diagnosis Date  . No pertinent past medical history    Past Surgical History  Procedure Laterality Date  . No past surgeries    . I&d extremity  07/29/2011    Procedure: IRRIGATION AND DEBRIDEMENT EXTREMITY;  Surgeon: Sharma Covert, MD;  Location: [facility] OR;  Service: Orthopedics;  Laterality: Left;  . Incision and drainage of wound  07/29/2011    Procedure: IRRIGATION AND DEBRIDEMENT WOUND;  Surgeon: Sharma Covert, MD;  Location: [facility] OR;  Service: Orthopedics;  Laterality: Left;    No family history on file. History  Substance Use Topics  . Smoking status: Former Smoker -- 4 years    Types: Cigarettes    Quit date: 04/08/2011  . Smokeless tobacco: Never Used  . Alcohol Use: 6.0 oz/week    6 Cans of beer, 4 Shots of liquor per week    Review of Systems A complete 10 system review of systems was obtained and all systems are negative except as noted in the HPI and PMH.  Allergies  Review of patient's allergies indicates no known allergies.  Home Medications  No current outpatient prescriptions on file. BP 124/83  Pulse 78  Temp(Src) 99.1 F (37.3 C) (Oral)  Resp 20  Ht 5\' 7"  (1.702 m)  Wt 185 lb (83.915 kg)  BMI 28.97 kg/m2  SpO2 100% Physical Exam  Nursing note and vitals reviewed. Constitutional: He is oriented to person, place, and time. He appears well-developed and well-nourished.  HENT:  Head: Normocephalic and atraumatic.  Eyes: EOM are normal. Pupils are equal, round, and reactive to light.  Neck: Normal range of motion. Neck supple. No tracheal deviation present.  Cardiovascular: Normal rate and intact distal pulses.   Brisk cap refill.   Pulmonary/Chest: Effort normal. No respiratory distress.  Abdominal: Soft. He exhibits no distension.  Musculoskeletal: Normal range of motion. He exhibits no edema.  Left hand finger ROM and strength reduced secondary to pain.   Neurological: He is alert and oriented to person, place, and time.  Sensation intact.  Skin: Skin is warm and dry. Laceration noted.  2 cm laceration to the palmar aspect of the left hand. No visible foreign bodies. No evidence of tendon or ligament damage. No arterial injury.  Further exam pending with irrigation.  Psychiatric: He is aggressive.  Aggressive. Belligerent.     ED Course   Procedures (including critical care time)  DIAGNOSTIC STUDIES: Oxygen Saturation is 100% on room air, normal by my interpretation.    COORDINATION OF CARE:  15:10-  Will order plain  films to evaluate for foreign body.  Will the irrigate and repair minor laceration.  15:15- Patient states that he was seen previously and had insurance problems.  I told the patient that, "I do not know if you have insurance, and it doesn't matter because I am going to treat you the same. I will treat you the same no matter what."  Patient then asked for security to be called so that he could sort out his insurance.  I am uncertain as to why.  He was not agitated when I spoke to him.  Charge nurse then came and spoke to him and told him that we would sort out the insurance, and repeated that the providers don't know insurance status.  Sometime after I had left and before the charge nurse approached the patient, he became very belligerent.  Tried to console the patient, my guess is that he misunderstand my meaning when I told him that it didn't matter if he had insurance.  However, this was stressed after my conversation with him, that it didn't matter because we would treat him no matter what.  At this point, the patient was beyond listening and was swearing and being verbally abusive to staff.  He was then escorted out by [employer].  The laceration was minor.  It was not actively bleeding. The patient knew that we would be caring for him, as evident by his compliance with my initial exam and by him acknowledging that he would need an xray prior to my repairing the laceration.    Labs Reviewed - No data to display No results found. 1. Laceration     MDM  Seemingly uncomplicated laceration, however, I was unable to complete my exam.  Will repair following plain films to rule out foreign body.  I highly doubt that the patient lost anywhere remotely close to a pint of blood.  He was coherent when I interviewed him.  VS are stable.  The laceration was very minor, and was not bleeding on my exam.  There were no obvious complicating factors.  He is neurovascularly intact.  I personally performed the services  described in this documentation, which was scribed in my presence. The recorded information has been reviewed and is accurate.     Roxy Horseman, PA-C 07/31/12 1723  Roxy Horseman, PA-C 07/31/12 [phone]

## 2012-07-31 NOTE — Progress Notes (Signed)
Orthopedic Tech Progress Note Patient Details:  Darin Morris June 27, 1985 [id number]  Ortho Devices Type of Ortho Device: Ace wrap;Ulna gutter splint Ortho Device/Splint Location: left hand Ortho Device/Splint Interventions: Application   Nikki Dom 07/31/2012, 6:48 PM

## 2012-07-31 NOTE — ED Provider Notes (Signed)
Medical screening examination/treatment/procedure(s) were performed by non-physician practitioner and as supervising physician I was immediately available for consultation/collaboration.   Ansel Ferrall, MD 07/31/12 1844 

## 2012-07-31 NOTE — ED Notes (Signed)
Per EMS pt c/o cut his lt hand on a piece of metal, bandage applied, bleeding controlled.

## 2012-07-31 NOTE — ED Notes (Addendum)
Laceration to his lt. Palm cut on air condtioner, bleeding controlled, Pt.  Reports that he is unable to make a fist or move his fingers, +radial pulse

## 2012-08-01 NOTE — ED Provider Notes (Signed)
CSN: [id number]     Arrival date & time 07/31/12  1556 History     First MD Initiated Contact with Patient 07/31/12 1652     Chief Complaint  Patient presents with  . Laceration   (Consider location/radiation/quality/duration/timing/severity/associated sxs/prior Treatment) HPI Comments: Darin Morris is a 27 y.o. male who presents to the Emergency Department complaining of a 3 cm laceration to the hypothenar eminence of his left hand onset after accidentally cutting his hand on a sharp metal edge while attempting to fix his air conditioner. He states that he has numbness and tingling in the fingers of his left hand at baseline after a dog bite about 1 year ago. Numbness and tingling in his 4th and 5th fingers are worsened from the laceration today. He also reports associated reduced ROM of the 4th and 5th fingers of his left hand. His tetanus shot is UTD. He denies fever, chills, nausea, vomiting or any other symptoms.   Patient is a 27 y.o. male presenting with skin laceration. The history is provided by the patient. No language interpreter was used.  Laceration   Past Medical History  Diagnosis Date  . No pertinent past medical history    Past Surgical History  Procedure Laterality Date  . No past surgeries    . I&d extremity  07/29/2011    Procedure: IRRIGATION AND DEBRIDEMENT EXTREMITY;  Surgeon: Sharma Covert, MD;  Location: [facility] OR;  Service: Orthopedics;  Laterality: Left;  . Incision and drainage of wound  07/29/2011    Procedure: IRRIGATION AND DEBRIDEMENT WOUND;  Surgeon: Sharma Covert, MD;  Location: [facility] OR;  Service: Orthopedics;  Laterality: Left;   No family history on file. History  Substance Use Topics  . Smoking status: Former Smoker -- 4 years    Types: Cigarettes    Quit date: 04/08/2011  . Smokeless tobacco: Never Used  . Alcohol Use: 6.0 oz/week    6 Cans of beer, 4 Shots of liquor per week    Review of Systems  Constitutional: Negative for fever and  chills.  Gastrointestinal: Negative for nausea and vomiting.  Skin: Positive for wound.  Neurological: Positive for weakness and numbness.  All other systems reviewed and are negative.    Allergies  Review of patient's allergies indicates no known allergies.  Home Medications   Current Outpatient Rx  Name  Route  Sig  Dispense  Refill  . HYDROcodone-acetaminophen (NORCO/VICODIN) 5-325 MG per tablet   Oral   Take 2 tablets by mouth every 6 (six) hours as needed for pain.   12 tablet   0   . promethazine (PHENERGAN) 25 MG tablet   Oral   Take 1 tablet (25 mg total) by mouth every 6 (six) hours as needed for nausea.   12 tablet   0    BP 141/98  Pulse 66  Temp(Src) 98.4 F (36.9 C) (Oral)  Resp 20  SpO2 98% Physical Exam  Nursing note and vitals reviewed. Constitutional: He is oriented to person, place, and time. He appears well-developed and well-nourished. No distress.  HENT:  Head: Normocephalic and atraumatic.  Right Ear: External ear normal.  Left Ear: External ear normal.  Nose: Nose normal.  Eyes: Conjunctivae are normal.  Neck: Normal range of motion. No tracheal deviation present.  Cardiovascular: Normal rate, regular rhythm, normal heart sounds, intact distal pulses and normal pulses.   Cap refuill less than 2 seconds in all fingers  Pulmonary/Chest: Effort normal and breath sounds normal. No  stridor.  Abdominal: Soft. He exhibits no distension. There is no tenderness.  Musculoskeletal: Normal range of motion.  Unable to move his 4th and 5th fingers  Neurological: He is alert and oriented to person, place, and time.  Skin: Skin is warm and dry. He is not diaphoretic.  3 cm laceration to the hypothenar eminence of his left hand  Psychiatric: He has a normal mood and affect. His behavior is normal.    ED Course   Procedures (including critical care time)  Labs Reviewed - No data to display  5:00 PM Patient advised to have an XR of his left hand.  Patient agrees 5:15 PM - Discussed case with Dr. Amanda Pea. Plan is to loosely close the wound, splint patient and have him follow up in the office with Dr. Amanda Pea on Tuesday. Discussed that he needs to call the office and make an appointment.   LACERATION REPAIR Performed by: Junious Silk Authorized by: Junious Silk Consent: Verbal consent obtained. Risks and benefits: risks, benefits and alternatives were discussed Consent given by: patient Patient identity confirmed: provided demographic data Prepped and Draped in normal sterile fashion Wound explored  Laceration Location: hypothenar eminence of left hand  Laceration Length: 3 cm  No Foreign Bodies seen or palpated  Anesthesia: local infiltration  Local anesthetic: lidocaine 2%   Anesthetic total: 3 ml  Irrigation method: syringe Amount of cleaning: standard  Skin closure: 4-0 Ethilon  Number of sutures: 5  Technique: simple interrupted   Patient tolerance: Patient tolerated the procedure well with no immediate complications.   Dg Hand Complete Left  07/31/2012   *RADIOLOGY REPORT*  Clinical Data: Anterior puncture wound.  LEFT HAND - COMPLETE 3+ VIEW  Comparison: None.  Findings: The mineralization and alignment are normal.  There is no evidence of acute fracture or dislocation.  No soft tissue emphysema or foreign body is identified.  IMPRESSION: No acute osseous findings or evidence of foreign body.   Original Report Authenticated By: Carey Bullocks, M.D.   1. Laceration of right hand with complication, initial encounter   2. Laceration     MDM  Tdap UTD. Wound cleaning complete with pressure irrigation, bottom of wound visualized, no foreign bodies appreciated. Laceration occurred < 8 hours prior to repair which was well tolerated. Pt has no co morbidities to effect normal wound healing. Discussed suture home care w pt and answered questions. Patient is to follow up with Dr. Amanda Pea on Tuesday. Site was closed  loosely and patient was placed in ulnar gutter splint. Dr. Denton Lank evaluated patient and agrees with plan. Pt is hemodynamically stable w no complaints prior to dc.      [name], PA-C 08/01/12 1045

## 2012-08-04 NOTE — ED Provider Notes (Signed)
Medical screening examination/treatment/procedure(s) were conducted as a shared visit with non-physician practitioner(s) and myself.  I personally evaluated the patient during the encounter Pt with lac to hypothenar region hand from metal edge air conditioner, accidental. C/o tingling to ring and small fingers, and inability to flex small finger. Xr. Hand consult.   Suzi Roots, MD 08/04/12 [phone]

## 2012-08-11 ENCOUNTER — Encounter (HOSPITAL_COMMUNITY): Payer: Self-pay | Admitting: Orthopedic Surgery

## 2012-08-11 NOTE — Progress Notes (Signed)
Pt denies SOB, chest pain, and being under the care of a cardiologist. Pt denies having a stress test, echo, and cardiac catheterization. Pt made aware to Stop taking Aspirin and herbal medications. Do not take any NSAIDs ie: Ibuprofen, Advil, Naproxen or any medication containing Aspirin.

## 2012-08-12 ENCOUNTER — Ambulatory Visit (HOSPITAL_COMMUNITY): Payer: Medicaid - Out of State | Admitting: Anesthesiology

## 2012-08-12 ENCOUNTER — Encounter (HOSPITAL_COMMUNITY)
Admission: RE | Disposition: A | Discharge status: Home / Self Care | Payer: Self-pay | Source: Ambulatory Visit | Attending: Orthopedic Surgery

## 2012-08-12 ENCOUNTER — Encounter (HOSPITAL_COMMUNITY): Payer: Self-pay | Admitting: *Deleted

## 2012-08-12 ENCOUNTER — Encounter (HOSPITAL_COMMUNITY): Payer: Self-pay | Admitting: Anesthesiology

## 2012-08-12 ENCOUNTER — Ambulatory Visit (HOSPITAL_COMMUNITY)
Admission: RE | Admit: 2012-08-12 | Discharge: 2012-08-12 | Disposition: A | Discharge status: Home / Self Care | Payer: Medicaid - Out of State | Source: Ambulatory Visit | Attending: Orthopedic Surgery | Admitting: Orthopedic Surgery

## 2012-08-12 DIAGNOSIS — S5400XA Injury of ulnar nerve at forearm level, unspecified arm, initial encounter: Secondary | ICD-10-CM | POA: Insufficient documentation

## 2012-08-12 DIAGNOSIS — S61409A Unspecified open wound of unspecified hand, initial encounter: Secondary | ICD-10-CM | POA: Insufficient documentation

## 2012-08-12 DIAGNOSIS — S55809A Unspecified injury of other blood vessels at forearm level, unspecified arm, initial encounter: Secondary | ICD-10-CM | POA: Insufficient documentation

## 2012-08-12 DIAGNOSIS — S66909A Unspecified injury of unspecified muscle, fascia and tendon at wrist and hand level, unspecified hand, initial encounter: Secondary | ICD-10-CM | POA: Insufficient documentation

## 2012-08-12 DIAGNOSIS — S61509A Unspecified open wound of unspecified wrist, initial encounter: Secondary | ICD-10-CM | POA: Insufficient documentation

## 2012-08-12 DIAGNOSIS — Y929 Unspecified place or not applicable: Secondary | ICD-10-CM | POA: Insufficient documentation

## 2012-08-12 DIAGNOSIS — W269XXA Contact with unspecified sharp object(s), initial encounter: Secondary | ICD-10-CM | POA: Insufficient documentation

## 2012-08-12 DIAGNOSIS — F17200 Nicotine dependence, unspecified, uncomplicated: Secondary | ICD-10-CM | POA: Insufficient documentation

## 2012-08-12 HISTORY — PX: WOUND EXPLORATION: SHX6188

## 2012-08-12 HISTORY — PX: NERVE AND TENDON REPAIR: SHX5693

## 2012-08-12 SURGERY — WOUND EXPLORATION
Anesthesia: General | Site: Hand | Laterality: Left | Wound class: Clean

## 2012-08-12 MED ORDER — EVICEL 2 ML EX KIT
PACK | CUTANEOUS | Status: DC | PRN
  Administered 2012-08-12: 2 mL

## 2012-08-12 MED ORDER — CHLORHEXIDINE GLUCONATE 4 % EX LIQD: 60.0000 mL | Freq: Once | CUTANEOUS | Status: DC

## 2012-08-12 MED ORDER — OXYCODONE HCL 5 MG PO TABS
5.0000 mg | ORAL_TABLET | Freq: Once | ORAL | Status: AC | PRN
  Administered 2012-08-12: 5 mg via ORAL

## 2012-08-12 MED ORDER — VITAMIN C 500 MG PO TABS: 500.0000 mg | ORAL_TABLET | Freq: Two times a day (BID) | ORAL | Status: DC

## 2012-08-12 MED ORDER — LACTATED RINGERS IV SOLN
INTRAVENOUS | Status: DC
  Administered 2012-08-12 (×2): via INTRAVENOUS

## 2012-08-12 MED ORDER — HYDROCODONE-ACETAMINOPHEN 5-325 MG PO TABS: 2.0000 | ORAL_TABLET | Freq: Once | ORAL | Status: AC

## 2012-08-12 MED ORDER — OXYCODONE-ACETAMINOPHEN 10-325 MG PO TABS: 1.0000 | ORAL_TABLET | ORAL | Status: DC | PRN

## 2012-08-12 MED ORDER — BUPIVACAINE HCL (PF) 0.25 % IJ SOLN
INTRAMUSCULAR | Status: AC
  Filled 2012-08-12: qty 30

## 2012-08-12 MED ORDER — BUPIVACAINE HCL (PF) 0.5 % IJ SOLN: INTRAMUSCULAR | Status: DC | PRN

## 2012-08-12 MED ORDER — OXYCODONE HCL 5 MG PO TABS
ORAL_TABLET | ORAL | Status: AC
  Filled 2012-08-12: qty 1

## 2012-08-12 MED ORDER — HYDROMORPHONE HCL PF 1 MG/ML IJ SOLN
0.2500 mg | INTRAMUSCULAR | Status: DC | PRN
  Administered 2012-08-12: 0.5 mg via INTRAVENOUS

## 2012-08-12 MED ORDER — SODIUM CHLORIDE 0.9 % IR SOLN
Status: DC | PRN
  Administered 2012-08-12: 19:00:00

## 2012-08-12 MED ORDER — OXYCODONE HCL 5 MG/5ML PO SOLN: 5.0000 mg | Freq: Once | ORAL | Status: AC | PRN

## 2012-08-12 MED ORDER — LIDOCAINE HCL (CARDIAC) 20 MG/ML IV SOLN
INTRAVENOUS | Status: DC | PRN
  Administered 2012-08-12: 80 mg via INTRAVENOUS

## 2012-08-12 MED ORDER — 0.9 % SODIUM CHLORIDE (POUR BTL) OPTIME
TOPICAL | Status: DC | PRN
  Administered 2012-08-12: 1000 mL

## 2012-08-12 MED ORDER — EVICEL 2 ML EX KIT
PACK | CUTANEOUS | Status: AC
  Filled 2012-08-12: qty 1

## 2012-08-12 MED ORDER — HYDROCODONE-ACETAMINOPHEN 5-325 MG PO TABS
ORAL_TABLET | ORAL | Status: AC
  Administered 2012-08-12: 2 via ORAL
  Filled 2012-08-12: qty 2

## 2012-08-12 MED ORDER — ASPIRIN EC 325 MG PO TBEC: 325.0000 mg | DELAYED_RELEASE_TABLET | Freq: Every day | ORAL | Status: DC

## 2012-08-12 MED ORDER — FENTANYL CITRATE 0.05 MG/ML IJ SOLN
INTRAMUSCULAR | Status: DC | PRN
  Administered 2012-08-12: 50 ug via INTRAVENOUS
  Administered 2012-08-12 (×3): 25 ug via INTRAVENOUS
  Administered 2012-08-12: 50 ug via INTRAVENOUS
  Administered 2012-08-12: 25 ug via INTRAVENOUS

## 2012-08-12 MED ORDER — CEFAZOLIN SODIUM-DEXTROSE 2-3 GM-% IV SOLR
2.0000 g | INTRAVENOUS | Status: AC
  Administered 2012-08-12: 2 g via INTRAVENOUS
  Filled 2012-08-12: qty 50

## 2012-08-12 MED ORDER — PROPOFOL 10 MG/ML IV BOLUS
INTRAVENOUS | Status: DC | PRN
  Administered 2012-08-12: 200 mg via INTRAVENOUS

## 2012-08-12 MED ORDER — ONDANSETRON HCL 4 MG/2ML IJ SOLN: 4.0000 mg | Freq: Once | INTRAMUSCULAR | Status: DC | PRN

## 2012-08-12 MED ORDER — MIDAZOLAM HCL 5 MG/5ML IJ SOLN
INTRAMUSCULAR | Status: DC | PRN
  Administered 2012-08-12: 2 mg via INTRAVENOUS

## 2012-08-12 MED ORDER — HYDROMORPHONE HCL PF 1 MG/ML IJ SOLN
INTRAMUSCULAR | Status: AC
  Filled 2012-08-12: qty 1

## 2012-08-12 MED ORDER — DOCUSATE SODIUM 100 MG PO CAPS: 100.0000 mg | ORAL_CAPSULE | Freq: Two times a day (BID) | ORAL | Status: DC

## 2012-08-12 MED ORDER — BUPIVACAINE HCL 0.25 % IJ SOLN
INTRAMUSCULAR | Status: DC | PRN
  Administered 2012-08-12: 10 mL

## 2012-08-12 MED ORDER — ONDANSETRON HCL 4 MG/2ML IJ SOLN
INTRAMUSCULAR | Status: DC | PRN
  Administered 2012-08-12: 4 mg via INTRAVENOUS

## 2012-08-12 SURGICAL SUPPLY — 70 items
BAG DECANTER FOR FLEXI CONT (MISCELLANEOUS) ×3 IMPLANT
BANDAGE ELASTIC 3 VELCRO ST LF (GAUZE/BANDAGES/DRESSINGS) ×3 IMPLANT
BANDAGE ELASTIC 4 VELCRO ST LF (GAUZE/BANDAGES/DRESSINGS) IMPLANT
BANDAGE GAUZE ELAST BULKY 4 IN (GAUZE/BANDAGES/DRESSINGS) IMPLANT
BLADE SURG 15 STRL LF DISP TIS (BLADE) IMPLANT
BLADE SURG 15 STRL SS (BLADE)
BNDG ESMARK 4X9 LF (GAUZE/BANDAGES/DRESSINGS) ×3 IMPLANT
CLOTH BEACON ORANGE TIMEOUT ST (SAFETY) ×3 IMPLANT
CORDS BIPOLAR (ELECTRODE) ×3 IMPLANT
COVER MAYO STAND STRL (DRAPES) IMPLANT
CUFF TOURNIQUET SINGLE 18IN (TOURNIQUET CUFF) ×3 IMPLANT
DRAPE SURG 17X11 SM STRL (DRAPES) ×3 IMPLANT
DRAPE SURG 17X23 STRL (DRAPES) ×3 IMPLANT
DRSG EMULSION OIL 3X3 NADH (GAUZE/BANDAGES/DRESSINGS) IMPLANT
DRSG PAD ABDOMINAL 8X10 ST (GAUZE/BANDAGES/DRESSINGS) IMPLANT
ELECT REM PT RETURN 9FT ADLT (ELECTROSURGICAL)
ELECTRODE REM PT RTRN 9FT ADLT (ELECTROSURGICAL) IMPLANT
GLOVE BIOGEL PI IND STRL 8.5 (GLOVE) ×2 IMPLANT
GLOVE BIOGEL PI INDICATOR 8.5 (GLOVE) ×1
GLOVE SURG ORTHO 8.0 STRL STRW (GLOVE) ×9 IMPLANT
GOWN PREVENTION PLUS XLARGE (GOWN DISPOSABLE) IMPLANT
GOWN PREVENTION PLUS XXLARGE (GOWN DISPOSABLE) ×3 IMPLANT
GOWN STRL REIN XL XLG (GOWN DISPOSABLE) ×3 IMPLANT
IV NS IRRIG 3000ML ARTHROMATIC (IV SOLUTION) IMPLANT
KIT BASIN OR (CUSTOM PROCEDURE TRAY) ×3 IMPLANT
LOOP VESSEL MAXI BLUE (MISCELLANEOUS) IMPLANT
LOOP VESSEL MINI RED (MISCELLANEOUS) IMPLANT
MANIFOLD NEPTUNE II (INSTRUMENTS) ×3 IMPLANT
NEEDLE HYPO 25GX1X1/2 BEV (NEEDLE) ×3 IMPLANT
NEEDLE HYPO 25X1 1.5 SAFETY (NEEDLE) IMPLANT
NS IRRIG 1000ML POUR BTL (IV SOLUTION) ×3 IMPLANT
PACK ORTHO EXTREMITY (CUSTOM PROCEDURE TRAY) ×3 IMPLANT
PAD CAST 3X4 CTTN HI CHSV (CAST SUPPLIES) ×2 IMPLANT
PAD CAST 4YDX4 CTTN HI CHSV (CAST SUPPLIES) IMPLANT
PADDING CAST ABS 4INX4YD NS (CAST SUPPLIES)
PADDING CAST ABS COTTON 4X4 ST (CAST SUPPLIES) IMPLANT
PADDING CAST COTTON 3X4 STRL (CAST SUPPLIES) ×1
PADDING CAST COTTON 4X4 STRL (CAST SUPPLIES)
SET CYSTO W/LG BORE CLAMP LF (SET/KITS/TRAYS/PACK) IMPLANT
SLEEVE SURGEON STRL (DRAPES) ×6 IMPLANT
SPEAR EYE SURG WECK-CEL (MISCELLANEOUS) ×24 IMPLANT
SPLINT FIBERGLASS 4X30 (CAST SUPPLIES) ×3 IMPLANT
SPONGE GAUZE 4X4 12PLY (GAUZE/BANDAGES/DRESSINGS) IMPLANT
SUCTION FRAZIER TIP 10 FR DISP (SUCTIONS) IMPLANT
SUT ETHIBOND 3-0 V-5 (SUTURE) IMPLANT
SUT ETHILON 4 0 PS 2 18 (SUTURE) ×3 IMPLANT
SUT ETHILON 7 0 P 6 18 (SUTURE) ×3 IMPLANT
SUT ETHILON 8 0 BV130 4 (SUTURE) ×12 IMPLANT
SUT FIBERWIRE 3-0 18 DIAM 3/8 (SUTURE) ×9
SUT FIBERWIRE 4-0 18 DIAM BLUE (SUTURE) ×3
SUT MERSILENE 4 0 P 3 (SUTURE) IMPLANT
SUT PROLENE 3 0 PS 2 (SUTURE) IMPLANT
SUT PROLENE 4 0 PS 2 18 (SUTURE) IMPLANT
SUT VIC AB 1 CT1 27 (SUTURE)
SUT VIC AB 1 CT1 27XBRD ANTBC (SUTURE) IMPLANT
SUT VIC AB 2-0 CT1 27 (SUTURE)
SUT VIC AB 2-0 CT1 27XBRD (SUTURE) IMPLANT
SUT VIC AB 2-0 SH 27 (SUTURE)
SUT VIC AB 2-0 SH 27XBRD (SUTURE) IMPLANT
SUT VICRYL 4-0 PS2 18IN ABS (SUTURE) IMPLANT
SUTURE FIBERWR 3-0 18 DIAM 3/8 (SUTURE) ×6 IMPLANT
SUTURE FIBERWR 4-0 18 DIA BLUE (SUTURE) ×2 IMPLANT
SYR 20CC LL (SYRINGE) ×3 IMPLANT
SYR CONTROL 10ML LL (SYRINGE) ×6 IMPLANT
TIP FLEX 45CM EVICEL (HEMOSTASIS) ×3 IMPLANT
TOWEL OR 17X26 10 PK STRL BLUE (TOWEL DISPOSABLE) ×3 IMPLANT
TUBE CONNECTING 12X1/4 (SUCTIONS) ×3 IMPLANT
UNDERPAD 30X30 INCONTINENT (UNDERPADS AND DIAPERS) ×3 IMPLANT
VISITEC ×3 IMPLANT
WATER STERILE IRR 1000ML POUR (IV SOLUTION) IMPLANT

## 2012-08-12 NOTE — Transfer of Care (Signed)
Immediate Anesthesia Transfer of Care Note  Patient: Darin Morris  Procedure(s) Performed: Procedure(s): LEFT HAND OPEN WOUND EXPLORATION (Left) WITH NERVE AND TENDON REPAIR (Left)  Patient Location: PACU  Anesthesia Type:General  Level of Consciousness: awake, alert  and oriented  Airway & Oxygen Therapy: Patient Spontanous Breathing and Patient connected to nasal cannula oxygen  Post-op Assessment: Report given to PACU RN and Post -op Vital signs reviewed and stable  Post vital signs: Reviewed and stable  Complications: No apparent anesthesia complications

## 2012-08-12 NOTE — Preoperative (Signed)
Beta Blockers   Reason not to administer Beta Blockers:Not Applicable 

## 2012-08-12 NOTE — Anesthesia Procedure Notes (Signed)
Procedure Name: LMA Insertion Date/Time: 08/12/2012 4:47 PM Performed by: Jerilee Hoh Pre-anesthesia Checklist: Patient identified, Emergency Drugs available, Suction available and Patient being monitored Patient Re-evaluated:Patient Re-evaluated prior to inductionOxygen Delivery Method: Circle system utilized Preoxygenation: Pre-oxygenation with 100% oxygen Intubation Type: IV induction Ventilation: Mask ventilation without difficulty LMA: LMA inserted LMA Size: 4.0 Number of attempts: 1 Placement Confirmation: positive ETCO2 and breath sounds checked- equal and bilateral Tube secured with: Tape Dental Injury: Teeth and Oropharynx as per pre-operative assessment

## 2012-08-12 NOTE — Anesthesia Postprocedure Evaluation (Signed)
  Anesthesia Post-op Note  Patient: Darin Morris  Procedure(s) Performed: Procedure(s): LEFT HAND OPEN WOUND EXPLORATION (Left) WITH NERVE AND TENDON REPAIR (Left)  Patient Location: PACU  Anesthesia Type:General  Level of Consciousness: awake and alert   Airway and Oxygen Therapy: Patient Spontanous Breathing  Post-op Pain: mild  Post-op Assessment: Post-op Vital signs reviewed, Patient's Cardiovascular Status Stable, Respiratory Function Stable, Patent Airway and No signs of Nausea or vomiting  Post-op Vital Signs: Reviewed and stable  Complications: No apparent anesthesia complications

## 2012-08-12 NOTE — Anesthesia Preprocedure Evaluation (Signed)
Anesthesia Evaluation  Patient identified by MRN, date of birth, ID band Patient awake    Reviewed: Allergy & Precautions, H&P , NPO status , Patient's Chart, lab work & pertinent test results  Airway Mallampati: I TM Distance: >3 FB Neck ROM: Full    Dental  (+) Teeth Intact and Dental Advisory Given   Pulmonary  breath sounds clear to auscultation        Cardiovascular Rhythm:Regular     Neuro/Psych    GI/Hepatic   Endo/Other    Renal/GU      Musculoskeletal   Abdominal   Peds  Hematology   Anesthesia Other Findings   Reproductive/Obstetrics                           Anesthesia Physical Anesthesia Plan  ASA: II  Anesthesia Plan:    Post-op Pain Management:    Induction: Intravenous  Airway Management Planned: LMA  Additional Equipment:   Intra-op Plan:   Post-operative Plan: Extubation in OR  Informed Consent: I have reviewed the patients History and Physical, chart, labs and discussed the procedure including the risks, benefits and alternatives for the proposed anesthesia with the patient or authorized representative who has indicated his/her understanding and acceptance.   Dental advisory given  Plan Discussed with: CRNA, Anesthesiologist and Surgeon  Anesthesia Plan Comments:         Anesthesia Quick Evaluation

## 2012-08-12 NOTE — H&P (Signed)
Darin Morris is an 27 y.o. male.   Chief Complaint: left hand laceration  HPI: pt sustained laceration to left hand Pt underwent emergency services on day of injury and referred by ed to my office to continue his ed care Pt here for surgery on left hand for his laceration.  Past Medical History  Diagnosis Date  . No pertinent past medical history     Past Surgical History  Procedure Laterality Date  . No past surgeries    . I&d extremity  07/29/2011    Procedure: IRRIGATION AND DEBRIDEMENT EXTREMITY;  Surgeon: Sharma Covert, MD;  Location: [facility] OR;  Service: Orthopedics;  Laterality: Left;  . Incision and drainage of wound  07/29/2011    Procedure: IRRIGATION AND DEBRIDEMENT WOUND;  Surgeon: Sharma Covert, MD;  Location: [facility] OR;  Service: Orthopedics;  Laterality: Left;  . Knee surgery      Hx: of microscopic    No family history on file. Social History:  reports that he has been smoking Cigarettes.  He has a 1 pack-year smoking history. He has never used smokeless tobacco. He reports that he drinks about 6.0 ounces of alcohol per week. He reports that he does not use illicit drugs.  Allergies: No Known Allergies  No prescriptions prior to admission    No results found for this or any previous visit (from the past 48 hour(s)). No results found.  NO RECENT ILLNESSES OR HOSPITALIZATIONS  Height 5\' 7"  (1.702 m), weight 86.183 kg (190 lb). General Appearance:  Alert, cooperative, no distress, appears stated age  Head:  Normocephalic, without obvious abnormality, atraumatic  Eyes:  Pupils equal, conjunctiva/corneas clear,         Throat: Lips, mucosa, and tongue normal; teeth and gums normal  Neck: No visible masses     Lungs:   respirations unlabored  Chest Wall:  No tenderness or deformity  Heart:  Regular rate and rhythm,  Abdomen:   Soft, non-tender,         Extremities: LEFT HAND LACERATION OVER GUYONS CANAL WITH INABILITY TO FLEX RING AND SMALL FINGERS NO  INTRINSIC FUNCTION IN HAND CANNOT CROSS FINGERS OR ADDUCT THUMB FINGERS WARM WELL PERFUSED  Pulses: 2+ and symmetric  Skin: Skin color, texture, turgor normal, no rashes or lesions     Neurologic: Normal    Assessment/Plan LEFT HAND LACERATION WITH NERVE AND TENDON INVOLVEMENT  LEFT HAND WOUND EXPLORATION AND NERVE AND TENDON REPAIR  R/B/A DISCUSSED WITH PT IN OFFICE.  PT VOICED UNDERSTANDING OF PLAN CONSENT SIGNED DAY OF SURGERY PT SEEN AND EXAMINED PRIOR TO OPERATIVE PROCEDURE/DAY OF SURGERY SITE MARKED. QUESTIONS ANSWERED WILL [facility] FOLLOWING SURGERY  Sharma Covert 08/12/2012, 16:34 PM

## 2012-08-12 NOTE — Brief Op Note (Signed)
08/12/2012  4:34 PM  PATIENT:  Darin Morris  26 y.o. male  PRE-OPERATIVE DIAGNOSIS:  left hand laceration   POST-OPERATIVE DIAGNOSIS: same  PROCEDURE:  Procedure(s): LEFT HAND OPEN WOUND EXPLORATION (Left) WITH NERVE AND TENDON REPAIR (Left)  SURGEON:  Surgeon(s) and Role:    * Sharma Covert, MD - Primary  PHYSICIAN ASSISTANT: none  ASSISTANTS: none   ANESTHESIA:   general  EBL:   minimal  BLOOD ADMINISTERED:none  DRAINS: none   LOCAL MEDICATIONS USED:  MARCAINE     SPECIMEN:  No Specimen  DISPOSITION OF SPECIMEN:  N/A  COUNTS:  YES  TOURNIQUET:    DICTATION: .[id number]   PLAN OF CARE: Discharge to home after PACU  PATIENT DISPOSITION:  PACU - hemodynamically stable.   Delay start of Pharmacological VTE agent (>24hrs) due to surgical blood loss or risk of bleeding: not applicable

## 2012-08-13 ENCOUNTER — Encounter (HOSPITAL_COMMUNITY): Payer: Self-pay | Admitting: Orthopedic Surgery

## 2012-08-13 NOTE — Op Note (Signed)
NAME:  Darin Morris, Darin Morris NO.:  [id number]  MEDICAL RECORD NO.:  [id number]  LOCATION:  [facility]                         FACILITY:  [facility]  PHYSICIAN:  Madelynn Done, MD  DATE OF BIRTH:  Oct 16, 1985  DATE OF PROCEDURE:  08/12/2012 DATE OF DISCHARGE:  08/12/2012                              OPERATIVE REPORT   PREOPERATIVE DIAGNOSIS:  Left wrist laceration with tendon and nerve involvement.  POSTOPERATIVE DIAGNOSIS:  Left wrist laceration with tendon and nerve involvement.  ATTENDING PHYSICIAN:  Madelynn Done, MD who scrubbed and present for the entire procedure.  ASSISTANT SURGEON:  None.  ANESTHESIA:  General via LMA.  SURGICAL PROCEDURE: 1. Left wrist ulnar nerve repair, Guyon's canal. 2. Left wrist ulnar artery repair, Guyon's canal. 3. Left wrist ulnar nerve neurolysis and decompression and release of     the Guyon's canal. 4. Left hand carpal tunnel release. 5. Left hand flexor digitorum profundus to the small finger repair. 6. Left hand flexor digitorum profundus to the ring finger repair. 7. Left wrist flexor digitorum profundus to the long finger repair. 8. Left hand flexor digitorum superficialis to the ring finger repair. 9. Left hand flexor digitorum superficialis to the small finger. 10.Traumatic laceration repair, 5 cm. 11.Microscope used for artery and nerve repair.  SURGICAL INDICATIONS:  Darin Morris is a 27 year old right-hand-dominant gentleman, who sustained a laceration to the volar aspect of the wrist. The patient presented to the emergency department.  I was on-call.  The patient was taken care of in the emergency department and referred to my office for further treatment.  The patient was seen and evaluated in my office and based on the severity of his injury, it was recommended that he undergo the above procedure.  Risks, benefits, and alternatives were discussed in detail with the patient and signed informed consent was obtained.   Risks include, but not limited to bleeding, infection, damage to nearby nerves, arteries, or tendons, loss of motion of the wrist and digits, incomplete relief of symptoms, persistent ulnar nerve deficits and need for further surgical intervention.  DESCRIPTION OF PROCEDURE:  The patient was properly identified in the preoperative holding area and marked with a permanent marker made on the left wrist to indicate the correct operative site.  The patient was then brought back to the operating room, placed supine on the anesthesia room table.  General anesthesia was administered.  The patient tolerated this well.  A well-padded tourniquet was then placed on the left brachium and sealed with 1000 drape.  Left upper extremity was then prepped and draped in normal sterile fashion.  Time-out was called, correct side was identified, and the procedure was then begun.  Attention was then turned to the left wrist, where the 5 cm laceration was then extended both proximally and distally across the wrist crease.  The tourniquet had been insufflated.  Dissection was then carried down through the skin and subcutaneous tissue.  The ulnar nerve was then carefully identified proximally.  The ulnar nerve was then released through Guyon's canal that was then traced to the zone of injury.  Release of the ulnar nerve was then carried out, and neurolysis was then carried out proximally and  distally fraying of the nerve.  This was at the level of zone 2/3, however, the motor branch and sensory nerve about the takeoff and these were then carefully identified were the takeoff was and these nerves were then carefully dissected fraying.  The nerve ends were then carefully prepared using a 10 blade.  An 11 blade scalpel taken back to the nerves to healthy nerve fascicles on both proximally and distally for later repair.  The ulnar artery was then carefully identified and the artery was then identified both  proximally and distally.  The artery ends were then carefully dissected free in the clot using small vessel dilators.  The clot was then evacuated both proximally and distally. Once this was carried out, attention was then turned to the tendon repair.  A deep dissection was then carried out with the remainder of the transverse carpal ligament released along its ulnar border to expose the flexor tendons.  The flexor tendons were then carefully identified and then from an ulnar and deep to radial superficial and the tendons were then repaired with 3-0 FiberWire suture for the FDP tendons, combination of a 4-0 horizontal with mattress sutures as well as a figure-of-eight superficial sutures.  These were 6 strand repairs. After repair of both the FDS to the small and ring, there was also near complete laceration of the FDP to the long finger and this was repaired using 3-0 FiberWire suture.  Once this was carried out, the FDS was then repaired with 4-0 FiberWire suture with a modified Kessler in a horizontal mattress core suture supplemented by 2 side simple sutures for both the FDS to the ring and small.  After tendon repairs, the wounds were then thoroughly irrigated.  The microscope was then brought up and microvascular anastomosis was then carried out on the ulnar artery.  This was done with the 8-0 nylon suture and in repair with both on the uncircumferentially around the artery.  The artery was then carefully prepared, protecting the endothelium and the clot had been evacuated.  Small vessel close were then placed and then the tourniquet deflated, but there was a good blood flow both proximally and distally after evacuation of the lumen of the clot.  Copious wound, the heparin saline was then used to irrigate throughout.  After anastomosis of the artery, there was good flow both proximally and distally.  Following this, the nerve was then repaired.  The nerve ends were then  adequately repaired and epineural sutures were then used circumferentially around the takeoff at the edge zone 2 at the both the motor branch and sensory branch of the ulnar nerve.  Epineural sutures were then done on the volar aspect.  Several mL of Tisseel fibrin glue was then applied after epineural repair.  The wounds were then irrigated.  The traumatic laceration was then repaired with 3-0 simple Prolene sutures.  The remaining incision was then repaired with 3-0 and 4-0 Prolene sutures. A 10 mL of 0.25% Marcaine infiltrated locally.  Adaptic dressing, sterile compressive bandage was then applied.  The patient was then placed in a well-padded dorsal blocking splint, extubated, and taken to the recovery room in good condition.  POSTPROCEDURE PLAN:  The patient was discharged to home, seen back in the office in approximately 10 days for wound check, possible suture removal, and then begin a zone 4 flexor tendon rehab protocol.     Madelynn Done, MD     FWO/MEDQ  D:  08/12/2012  T:  08/13/2012  Job:  [id number]

## 2012-08-19 ENCOUNTER — Encounter (HOSPITAL_COMMUNITY): Payer: Self-pay | Admitting: Emergency Medicine

## 2012-08-19 ENCOUNTER — Emergency Department (HOSPITAL_COMMUNITY)
Admission: EM | Admit: 2012-08-19 | Discharge: 2012-08-19 | Disposition: A | Discharge status: Home / Self Care | Payer: Self-pay | Source: Home / Self Care

## 2012-08-19 DIAGNOSIS — Z4789 Encounter for other orthopedic aftercare: Secondary | ICD-10-CM

## 2012-08-19 MED ORDER — HYDROMORPHONE HCL 1 MG/ML IJ SOLN: 1.0000 mg | Freq: Once | INTRAMUSCULAR | Status: DC

## 2012-08-19 MED ORDER — HYDROMORPHONE HCL PF 1 MG/ML IJ SOLN
INTRAMUSCULAR | Status: AC
  Filled 2012-08-19: qty 1

## 2012-08-19 MED ORDER — HYDROMORPHONE HCL PF 1 MG/ML IJ SOLN
1.0000 mg | Freq: Once | INTRAMUSCULAR | Status: AC
  Administered 2012-08-19: 1 mg via INTRAMUSCULAR

## 2012-08-19 NOTE — ED Provider Notes (Signed)
CSN: [id number]     Arrival date & time 08/19/12  1553 History     First MD Initiated Contact with Patient 08/19/12 1723     Chief Complaint  Patient presents with  . Cast Problem   (Consider location/radiation/quality/duration/timing/severity/associated sxs/prior Treatment) HPI   26 YO bm presents with the above complaint.  S/p left wrist laceration repair with ulnar nerve/artyery repair, multi tendon repair 6 Aug by dr Bradly Bienenstock.  States that the splint is too tight.  Having increased numbness, tingling and pain in the hand.  Worse when hand below heart level.  Has appt for first postop appt next week.    Past Medical History  Diagnosis Date  . No pertinent past medical history    Past Surgical History  Procedure Laterality Date  . No past surgeries    . I&d extremity  07/29/2011    Procedure: IRRIGATION AND DEBRIDEMENT EXTREMITY;  Surgeon: Sharma Covert, MD;  Location: [facility] OR;  Service: Orthopedics;  Laterality: Left;  . Incision and drainage of wound  07/29/2011    Procedure: IRRIGATION AND DEBRIDEMENT WOUND;  Surgeon: Sharma Covert, MD;  Location: [facility] OR;  Service: Orthopedics;  Laterality: Left;  . Knee surgery      Hx: of microscopic  . Wound exploration Left 08/12/2012    Procedure: LEFT HAND OPEN WOUND EXPLORATION;  Surgeon: Sharma Covert, MD;  Location: [facility] OR;  Service: Orthopedics;  Laterality: Left;  . Nerve and tendon repair Left 08/12/2012    Procedure: WITH NERVE AND TENDON REPAIR;  Surgeon: Sharma Covert, MD;  Location: [facility] OR;  Service: Orthopedics;  Laterality: Left;   No family history on file. History  Substance Use Topics  . Smoking status: Current Every Day Smoker -- 0.25 packs/day for 4 years    Types: Cigarettes  . Smokeless tobacco: Never Used  . Alcohol Use: 6.0 oz/week    6 Cans of beer, 4 Shots of liquor per week    Review of Systems  Constitutional: Negative.   Neurological: Negative for numbness.    Allergies  Review of patient's allergies  indicates no known allergies.  Home Medications   Current Outpatient Rx  Name  Route  Sig  Dispense  Refill  . oxyCODONE-acetaminophen (PERCOCET) 10-325 MG per tablet   Oral   Take 1 tablet by mouth every 4 (four) hours as needed for pain.   40 tablet   0   . aspirin EC 325 MG tablet   Oral   Take 1 tablet (325 mg total) by mouth daily.   30 tablet   0   . docusate sodium (COLACE) 100 MG capsule   Oral   Take 1 capsule (100 mg total) by mouth 2 (two) times daily.   10 capsule   0   . promethazine (PHENERGAN) 25 MG tablet   Oral   Take 1 tablet (25 mg total) by mouth every 6 (six) hours as needed for nausea.   12 tablet   0   . vitamin C (ASCORBIC ACID) 500 MG tablet   Oral   Take 1 tablet (500 mg total) by mouth 2 (two) times daily.   90 tablet   0    BP 130/78  Pulse 87  Temp(Src) 98 F (36.7 C) (Oral)  Resp 16  SpO2 100% Physical Exam  Constitutional: He is oriented to person, place, and time. He appears well-developed and well-nourished.  HENT:  Head: Normocephalic and atraumatic.  Eyes: EOM are normal.  Pupils are equal, round, and reactive to light.  Neck: Normal range of motion.  Pulmonary/Chest: Effort normal.  Musculoskeletal:  The ace bandage was tight on the upper forearm.  Removed ace bandages and he stated that he had some improvement in symptoms.  Continued to have some burning in small finger and palm of hand.  Moves fingers well.   Neurological: He is alert and oriented to person, place, and time.  Skin: Skin is warm.  Psychiatric: He has a normal mood and affect.    ED Course   Procedures (including critical care time)  Labs Reviewed - No data to display No results found. 1. Cast discomfort     MDM   I did speak with dr. Melvyn Novas since he is on call and he wants patient to f/u tomorrow morning at 1100hrs.  Patient advised that he must be seen.  Given a sling.  Will elevate hand above heart level as much as possible.      Zonia Kief,  PA-C 08/19/12 1749  Zonia Kief, PA-C 08/19/12 [phone]

## 2012-08-19 NOTE — ED Notes (Signed)
Pt c/o cast/splint being too tight... Seen at [facility] ER on 07/25 Sxs include: numbness and tingly of fingers Has appt w/Dr. Evaristo Bury office next Thursday Alert w/no signs of acute distress.

## 2012-08-22 NOTE — ED Provider Notes (Signed)
Medical screening examination/treatment/procedure(s) were performed by resident physician or non-physician practitioner and as supervising physician I was immediately available for consultation/collaboration.   Barkley Bruns MD.   Linna Hoff, MD 08/22/12 [phone]

## 2013-03-27 ENCOUNTER — Emergency Department (INDEPENDENT_AMBULATORY_CARE_PROVIDER_SITE_OTHER)
Admission: EM | Admit: 2013-03-27 | Discharge: 2013-03-27 | Disposition: A | Discharge status: Home / Self Care | Payer: Self-pay | Source: Home / Self Care | Attending: Family Medicine | Admitting: Family Medicine

## 2013-03-27 ENCOUNTER — Encounter (HOSPITAL_COMMUNITY): Payer: Self-pay | Admitting: Emergency Medicine

## 2013-03-27 DIAGNOSIS — T148XXA Other injury of unspecified body region, initial encounter: Principal | ICD-10-CM

## 2013-03-27 DIAGNOSIS — L089 Local infection of the skin and subcutaneous tissue, unspecified: Secondary | ICD-10-CM

## 2013-03-27 MED ORDER — SULFAMETHOXAZOLE-TRIMETHOPRIM 800-160 MG PO TABS: 2.0000 | ORAL_TABLET | Freq: Two times a day (BID) | ORAL | Status: DC

## 2013-03-27 MED ORDER — AMITRIPTYLINE HCL 25 MG PO TABS: 25.0000 mg | ORAL_TABLET | Freq: Every evening | ORAL | Status: DC | PRN

## 2013-03-27 NOTE — ED Provider Notes (Signed)
Lenise ArenaChristopher Lichty is a 28 y.o. male who presents to Urgent Care today for left wrist pain. Patient had any dramatic left shoulder nerve laceration at the wrist in August of 2014. This was surgically repaired by Dr. Mitzie Nartmann [facility]. Patient notes pain swelling discharge and difficulty with hand motion since the surgery. He initially notes tingling and pins and needle pain of the fifth and fourth digit. He notes that her over the past several days a normal suture has erupted through the skin and is causing some discharge and pain. No fevers or chills nausea vomiting or diarrhea.   Past Medical History  Diagnosis Date  . No pertinent past medical history    History  Substance Use Topics  . Smoking status: Current Every Day Smoker -- 0.25 packs/day for 4 years    Types: Cigarettes  . Smokeless tobacco: Never Used  . Alcohol Use: 6.0 oz/week    6 Cans of beer, 4 Shots of liquor per week   ROS as above Medications: No current facility-administered medications for this encounter.   Current Outpatient Prescriptions  Medication Sig Dispense Refill  . amitriptyline (ELAVIL) 25 MG tablet Take 1 tablet (25 mg total) by mouth at bedtime as needed for sleep.  30 tablet  1  . sulfamethoxazole-trimethoprim (SEPTRA DS) 800-160 MG per tablet Take 2 tablets by mouth 2 (two) times daily.  28 tablet  0  . [DISCONTINUED] promethazine (PHENERGAN) 25 MG tablet Take 1 tablet (25 mg total) by mouth every 6 (six) hours as needed for nausea.  12 tablet  0    Exam:  BP 133/74  Pulse 52  Temp(Src) 98.7 F (37.1 C) (Oral)  Resp 18  SpO2 100% Gen: Well NAD LEFT WRIST:  Large well-appearing volar scar at the ulnar aspect of the wrist.  Proximal and with erosion and blue absorbable suture erupting through the skin. Small amount of yellowish serosanguineous fluid is expressible.  Patient has movement of the skin at the scar with extension of the fourth and fifth finger.  The skin seems to be  adhered to the underlying tendon structures.  Patient has decreased bulk of the left hand compared to the right.   weakness with finger abduction Decrease range of motion of and extension of the fourth and fifth digit     Assessment and Plan: 28 y.o. male with  multiple issues. Patient's weakness and pins and needles are attributable to laceration of the ulnar nerve with subsequent repair and re-enervation. We'll treat this with amitriptyline. However patient has what appears to be adhesion of the scar to the flexor tendons and a possible wound infection. The suture was sent for culture and I am treating patient with empiric Doxycycline. Recommend second opinion if the patient is not happy with his hand surgeon. Followup if worsening   Discussed warning signs or symptoms. Please see discharge instructions. Patient expresses understanding.    Rodolph BongEvan S Brailee Riede, MD 03/27/13 [phone]

## 2013-03-27 NOTE — ED Notes (Signed)
Pt  Reports    Symptoms  Of      Drainage  And  Swelling  To  The  Incision  Site  Of     His  l  Wrist  Area          Pt  States he  Noticed  Some  Small  Strings  Present this  Am                   Pt  Reports       Last  Seen  Orthopedist  Middle   Of  Feb

## 2013-03-27 NOTE — Discharge Instructions (Signed)
Thank you for coming in today. Take amitriptyline at nighttime as needed. Use Bactrim twice daily for one week Followup with Dr. Merlyn LotKuzma if you want a second opinion  Neuropathic Pain We often think that pain has a physical cause. If we get rid of the cause, the pain should go away. Nerves themselves can also cause pain. It is called neuropathic pain, which means nerve abnormality. It may be difficult for the patients who have it and for the treating caregivers. Pain is usually described as acute (short-lived) or chronic (long-lasting). Acute pain is related to the physical sensations caused by an injury. It can last from a few seconds to many weeks, but it usually goes away when normal healing occurs. Chronic pain lasts beyond the typical healing time. With neuropathic pain, the nerve fibers themselves may be damaged or injured. They then send incorrect signals to other pain centers. The pain you feel is real, but the cause is not easy to find.  CAUSES  Chronic pain can result from diseases, such as diabetes and shingles (an infection related to chickenpox), or from trauma, surgery, or amputation. It can also happen without any known injury or disease. The nerves are sending pain messages, even though there is no identifiable cause for such messages.   Other common causes of neuropathy include diabetes, phantom limb pain, or Regional Pain Syndrome (RPS).  As with all forms of chronic back pain, if neuropathy is not correctly treated, there can be a number of associated problems that lead to a downward cycle for the patient. These include depression, sleeplessness, feelings of fear and anxiety, limited social interaction and inability to do normal daily activities or work.  The most dramatic and mysterious example of neuropathic pain is called "phantom limb syndrome." This occurs when an arm or a leg has been removed because of illness or injury. The brain still gets pain messages from the nerves that  originally carried impulses from the missing limb. These nerves now seem to misfire and cause troubling pain.  Neuropathic pain often seems to have no cause. It responds poorly to standard pain treatment. Neuropathic pain can occur after:  Shingles (herpes zoster virus infection).  A lasting burning sensation of the skin, caused usually by injury to a peripheral nerve.  Peripheral neuropathy which is widespread nerve damage, often caused by diabetes or alcoholism.  Phantom limb pain following an amputation.  Facial nerve problems (trigeminal neuralgia).  Multiple sclerosis.  Reflex sympathetic dystrophy.  Pain which comes with cancer and cancer chemotherapy.  Entrapment neuropathy such as when pressure is put on a nerve such as in carpal tunnel syndrome.  Back, leg, and hip problems (sciatica).  Spine or back surgery.  HIV Infection or AIDS where nerves are infected by viruses. Your caregiver can explain items in the above list which may apply to you. SYMPTOMS  Characteristics of neuropathic pain are:  Severe, sharp, electric shock-like, shooting, lightening-like, knife-like.  Pins and needles sensation.  Deep burning, deep cold, or deep ache.  Persistent numbness, tingling, or weakness.  Pain resulting from light touch or other stimulus that would not usually cause pain.  Increased sensitivity to something that would normally cause pain, such as a pinprick. Pain may persist for months or years following the healing of damaged tissues. When this happens, pain signals no longer sound an alarm about current injuries or injuries about to happen. Instead, the alarm system itself is not working correctly.  Neuropathic pain may get worse instead of better  over time. For some people, it can lead to serious disability. It is important to be aware that severe injury in a limb can occur without a proper, protective pain response.Burns, cuts, and other injuries may go unnoticed.  Without proper treatment, these injuries can become infected or lead to further disability. Take any injury seriously, and consult your caregiver for treatment. DIAGNOSIS  When you have a pain with no known cause, your caregiver will probably ask some specific questions:   Do you have any other conditions, such as diabetes, shingles, multiple sclerosis, or HIV infection?  How would you describe your pain? (Neuropathic pain is often described as shooting, stabbing, burning, or searing.)  Is your pain worse at any time of the day? (Neuropathic pain is usually worse at night.)  Does the pain seem to follow a certain physical pathway?  Does the pain come from an area that has missing or injured nerves? (An example would be phantom limb pain.)  Is the pain triggered by minor things such as rubbing against the sheets at night? These questions often help define the type of pain involved. Once your caregiver knows what is happening, treatment can begin. Anticonvulsant, antidepressant drugs, and various pain relievers seem to work in some cases. If another condition, such as diabetes is involved, better management of that disorder may relieve the neuropathic pain.  TREATMENT  Neuropathic pain is frequently long-lasting and tends not to respond to treatment with narcotic type pain medication. It may respond well to other drugs such as antiseizure and antidepressant medications. Usually, neuropathic problems do not completely go away, but partial improvement is often possible with proper treatment. Your caregivers have large numbers of medications available to treat you. Do not be discouraged if you do not get immediate relief. Sometimes different medications or a combination of medications will be tried before you receive the results you are hoping for. See your caregiver if you have pain that seems to be coming from nowhere and does not go away. Help is available.  SEEK IMMEDIATE MEDICAL CARE IF:   There  is a sudden change in the quality of your pain, especially if the change is on only one side of the body.  You notice changes of the skin, such as redness, black or purple discoloration, swelling, or an ulcer.  You cannot move the affected limbs. Document Released: 09/21/2003 Document Revised: 03/18/2011 Document Reviewed: 09/21/2003 [facility] Patient Information 2014 [place], [place].

## 2013-03-30 LAB — WOUND CULTURE: GRAM STAIN: NONE SEEN

## 2013-03-30 NOTE — ED Notes (Signed)
Abscess culture: Few Staph. Aureus.  Pt. adequately treated with Septra DS. Darin Morris, Darin Morris 03/30/2013

## 2013-04-16 ENCOUNTER — Emergency Department (HOSPITAL_COMMUNITY): Payer: Self-pay

## 2013-04-16 ENCOUNTER — Inpatient Hospital Stay (HOSPITAL_COMMUNITY)
Admission: EM | Admit: 2013-04-16 | Discharge: 2013-04-17 | DRG: 176 | Disposition: A | Discharge status: Home / Self Care | Payer: Self-pay | Attending: Internal Medicine | Admitting: Internal Medicine

## 2013-04-16 ENCOUNTER — Encounter (HOSPITAL_COMMUNITY): Payer: Self-pay | Admitting: Emergency Medicine

## 2013-04-16 DIAGNOSIS — R000 Tachycardia, unspecified: Secondary | ICD-10-CM | POA: Diagnosis present

## 2013-04-16 DIAGNOSIS — T370X5A Adverse effect of sulfonamides, initial encounter: Secondary | ICD-10-CM

## 2013-04-16 DIAGNOSIS — F1210 Cannabis abuse, uncomplicated: Secondary | ICD-10-CM | POA: Diagnosis present

## 2013-04-16 DIAGNOSIS — R079 Chest pain, unspecified: Secondary | ICD-10-CM

## 2013-04-16 DIAGNOSIS — Z21 Asymptomatic human immunodeficiency virus [HIV] infection status: Secondary | ICD-10-CM | POA: Diagnosis present

## 2013-04-16 DIAGNOSIS — L299 Pruritus, unspecified: Secondary | ICD-10-CM | POA: Diagnosis present

## 2013-04-16 DIAGNOSIS — Z7251 High risk heterosexual behavior: Secondary | ICD-10-CM

## 2013-04-16 DIAGNOSIS — I10 Essential (primary) hypertension: Secondary | ICD-10-CM | POA: Diagnosis present

## 2013-04-16 DIAGNOSIS — I2699 Other pulmonary embolism without acute cor pulmonale: Principal | ICD-10-CM | POA: Diagnosis present

## 2013-04-16 DIAGNOSIS — T50905A Adverse effect of unspecified drugs, medicaments and biological substances, initial encounter: Secondary | ICD-10-CM | POA: Diagnosis present

## 2013-04-16 DIAGNOSIS — L298 Other pruritus: Secondary | ICD-10-CM

## 2013-04-16 DIAGNOSIS — T887XXA Unspecified adverse effect of drug or medicament, initial encounter: Secondary | ICD-10-CM | POA: Diagnosis present

## 2013-04-16 DIAGNOSIS — Z8614 Personal history of Methicillin resistant Staphylococcus aureus infection: Secondary | ICD-10-CM

## 2013-04-16 DIAGNOSIS — F17200 Nicotine dependence, unspecified, uncomplicated: Secondary | ICD-10-CM | POA: Diagnosis present

## 2013-04-16 LAB — CBC
HEMATOCRIT: 36.7 % — AB (ref 39.0–52.0)
HEMOGLOBIN: 13 g/dL (ref 13.0–17.0)
MCH: 32.1 pg (ref 26.0–34.0)
MCHC: 35.4 g/dL (ref 30.0–36.0)
MCV: 90.6 fL (ref 78.0–100.0)
Platelets: 252 10*3/uL (ref 150–400)
RBC: 4.05 MIL/uL — ABNORMAL LOW (ref 4.22–5.81)
RDW: 12.3 % (ref 11.5–15.5)
WBC: 11.7 10*3/uL — ABNORMAL HIGH (ref 4.0–10.5)

## 2013-04-16 LAB — HEPATIC FUNCTION PANEL
ALBUMIN: 3.7 g/dL (ref 3.5–5.2)
ALK PHOS: 108 U/L (ref 39–117)
ALT: 28 U/L (ref 0–53)
AST: 30 U/L (ref 0–37)
Bilirubin, Direct: <0.2 mg/dL (ref 0.0–0.3)
Total Bilirubin: 0.3 mg/dL (ref 0.3–1.2)
Total Protein: 7.5 g/dL (ref 6.0–8.3)

## 2013-04-16 LAB — BASIC METABOLIC PANEL
BUN: 9 mg/dL (ref 6–23)
CALCIUM: 8.8 mg/dL (ref 8.4–10.5)
CO2: 24 mEq/L (ref 19–32)
Chloride: 95 mEq/L — ABNORMAL LOW (ref 96–112)
Creatinine, Ser: 0.78 mg/dL (ref 0.50–1.35)
GLUCOSE: 102 mg/dL — AB (ref 70–99)
Potassium: 3.8 mEq/L (ref 3.7–5.3)
Sodium: 134 mEq/L — ABNORMAL LOW (ref 137–147)

## 2013-04-16 LAB — URINALYSIS, ROUTINE W REFLEX MICROSCOPIC
BILIRUBIN URINE: NEGATIVE
GLUCOSE, UA: NEGATIVE mg/dL
HGB URINE DIPSTICK: NEGATIVE
KETONES UR: NEGATIVE mg/dL
Leukocytes, UA: NEGATIVE
Nitrite: NEGATIVE
PH: 6.5 (ref 5.0–8.0)
Protein, ur: NEGATIVE mg/dL
Specific Gravity, Urine: 1.019 (ref 1.005–1.030)
Urobilinogen, UA: 0.2 mg/dL (ref 0.0–1.0)

## 2013-04-16 LAB — I-STAT TROPONIN, ED: Troponin i, poc: 0.01 ng/mL (ref 0.00–0.08)

## 2013-04-16 MED ORDER — DIPHENHYDRAMINE HCL 25 MG PO CAPS
25.0000 mg | ORAL_CAPSULE | Freq: Once | ORAL | Status: AC
  Administered 2013-04-16: 25 mg via ORAL
  Filled 2013-04-16: qty 1

## 2013-04-16 MED ORDER — ONDANSETRON HCL 4 MG/2ML IJ SOLN: 4.0000 mg | Freq: Four times a day (QID) | INTRAMUSCULAR | Status: DC | PRN

## 2013-04-16 MED ORDER — OXYCODONE-ACETAMINOPHEN 5-325 MG PO TABS
1.0000 | ORAL_TABLET | Freq: Once | ORAL | Status: AC
  Administered 2013-04-16: 1 via ORAL
  Filled 2013-04-16: qty 1

## 2013-04-16 MED ORDER — ONDANSETRON HCL 4 MG/2ML IJ SOLN
4.0000 mg | Freq: Once | INTRAMUSCULAR | Status: AC
  Administered 2013-04-16: 4 mg via INTRAVENOUS
  Filled 2013-04-16: qty 2

## 2013-04-16 MED ORDER — SODIUM CHLORIDE 0.9 % IV BOLUS (SEPSIS)
1000.0000 mL | Freq: Once | INTRAVENOUS | Status: AC
  Administered 2013-04-16: 1000 mL via INTRAVENOUS

## 2013-04-16 MED ORDER — DIPHENHYDRAMINE HCL 50 MG/ML IJ SOLN: 12.5000 mg | Freq: Four times a day (QID) | INTRAMUSCULAR | Status: DC | PRN

## 2013-04-16 MED ORDER — PREDNISONE 20 MG PO TABS
60.0000 mg | ORAL_TABLET | Freq: Once | ORAL | Status: AC
  Administered 2013-04-16: 60 mg via ORAL
  Filled 2013-04-16: qty 3

## 2013-04-16 MED ORDER — OXYCODONE-ACETAMINOPHEN 5-325 MG PO TABS
1.0000 | ORAL_TABLET | Freq: Four times a day (QID) | ORAL | Status: DC | PRN
  Administered 2013-04-17: 2 via ORAL
  Filled 2013-04-16: qty 2

## 2013-04-16 MED ORDER — MORPHINE SULFATE 4 MG/ML IJ SOLN
4.0000 mg | INTRAMUSCULAR | Status: DC | PRN
  Administered 2013-04-16: 4 mg via INTRAVENOUS
  Filled 2013-04-16: qty 1

## 2013-04-16 MED ORDER — SODIUM CHLORIDE 0.9 % IV SOLN
INTRAVENOUS | Status: AC
  Administered 2013-04-16: 125 mL/h via INTRAVENOUS
  Administered 2013-04-17: 02:00:00 via INTRAVENOUS

## 2013-04-16 MED ORDER — FAMOTIDINE 20 MG PO TABS
20.0000 mg | ORAL_TABLET | Freq: Once | ORAL | Status: AC
  Administered 2013-04-16: 20 mg via ORAL
  Filled 2013-04-16: qty 1

## 2013-04-16 MED ORDER — RIVAROXABAN 15 MG PO TABS
15.0000 mg | ORAL_TABLET | Freq: Two times a day (BID) | ORAL | Status: DC
  Administered 2013-04-16 – 2013-04-17 (×2): 15 mg via ORAL
  Filled 2013-04-16 (×4): qty 1

## 2013-04-16 MED ORDER — RIVAROXABAN 15 MG PO TABS
15.0000 mg | ORAL_TABLET | Freq: Once | ORAL | Status: AC
  Administered 2013-04-16: 15 mg via ORAL
  Filled 2013-04-16: qty 1

## 2013-04-16 MED ORDER — RIVAROXABAN 20 MG PO TABS: 20.0000 mg | ORAL_TABLET | Freq: Every day | ORAL | Status: DC

## 2013-04-16 MED ORDER — MORPHINE SULFATE 4 MG/ML IJ SOLN
4.0000 mg | Freq: Once | INTRAMUSCULAR | Status: AC
  Administered 2013-04-16: 4 mg via INTRAVENOUS
  Filled 2013-04-16: qty 1

## 2013-04-16 MED ORDER — DIPHENHYDRAMINE HCL 50 MG/ML IJ SOLN
12.5000 mg | Freq: Once | INTRAMUSCULAR | Status: AC
  Administered 2013-04-16: 12.5 mg via INTRAVENOUS
  Filled 2013-04-16: qty 1

## 2013-04-16 MED ORDER — IOHEXOL 350 MG/ML SOLN
80.0000 mL | Freq: Once | INTRAVENOUS | Status: AC | PRN
  Administered 2013-04-16: 75 mL via INTRAVENOUS

## 2013-04-16 NOTE — ED Provider Notes (Signed)
CSN: [id number]     Arrival date & time 04/16/13  0306 History   First MD Initiated Contact with Patient 04/16/13 [phone]     Chief Complaint  Patient presents with  . Allergic Reaction     (Consider location/radiation/quality/duration/timing/severity/associated sxs/prior Treatment) Patient is a 28 y.o. male presenting with allergic reaction. The history is provided by the patient.  Allergic Reaction Presenting symptoms: difficulty breathing and itching   Presenting symptoms: no difficulty swallowing, no rash, no swelling and no wheezing   Itching:    Location:  Full body   Severity:  Mild   Onset quality:  Unable to specify   Timing:  Constant   Progression:  Unchanged Severity:  Mild Prior allergic episodes:  No prior episodes Context: medications   Relieved by:  None tried Worsened by:  Nothing tried Ineffective treatments:  None tried   Past Medical History  Diagnosis Date  . No pertinent past medical history    Past Surgical History  Procedure Laterality Date  . No past surgeries    . I&d extremity  07/29/2011    Procedure: IRRIGATION AND DEBRIDEMENT EXTREMITY;  Surgeon: Sharma Covert, MD;  Location: [facility] OR;  Service: Orthopedics;  Laterality: Left;  . Incision and drainage of wound  07/29/2011    Procedure: IRRIGATION AND DEBRIDEMENT WOUND;  Surgeon: Sharma Covert, MD;  Location: [facility] OR;  Service: Orthopedics;  Laterality: Left;  . Knee surgery      Hx: of microscopic  . Wound exploration Left 08/12/2012    Procedure: LEFT HAND OPEN WOUND EXPLORATION;  Surgeon: Sharma Covert, MD;  Location: [facility] OR;  Service: Orthopedics;  Laterality: Left;  . Nerve and tendon repair Left 08/12/2012    Procedure: WITH NERVE AND TENDON REPAIR;  Surgeon: Sharma Covert, MD;  Location: [facility] OR;  Service: Orthopedics;  Laterality: Left;   No family history on file. History  Substance Use Topics  . Smoking status: Current Every Day Smoker -- 0.25 packs/day for 4 years    Types: Cigarettes  .  Smokeless tobacco: Never Used  . Alcohol Use: 6.0 oz/week    6 Cans of beer, 4 Shots of liquor per week    Review of Systems  Constitutional: Negative for fever and chills.  HENT: Negative for trouble swallowing.   Eyes: Positive for redness. Negative for visual disturbance.  Respiratory: Positive for shortness of breath. Negative for wheezing.   Gastrointestinal: Negative for nausea and vomiting.  Musculoskeletal: Positive for myalgias.  Skin: Positive for itching. Negative for rash.  Neurological: Negative for dizziness and headaches.  All other systems reviewed and are negative.     Allergies  Bactrim  Home Medications   Current Outpatient Rx  Name  Route  Sig  Dispense  Refill  . oxyCODONE-acetaminophen (PERCOCET/ROXICET) 5-325 MG per tablet   Oral   Take 1 tablet by mouth every 6 (six) hours as needed for moderate pain or severe pain.   6 tablet   0   . Rivaroxaban (XARELTO) 15 MG TABS tablet   Oral   Take 1 tablet (15 mg total) by mouth 2 (two) times daily with a meal.   40 tablet   0     To be taken 4/12- 05/07/2013   . Rivaroxaban (XARELTO) 20 MG TABS tablet   Oral   Take 1 tablet (20 mg total) by mouth daily with supper.   30 tablet   5     To begin 05/08/2013  BP 115/43  Pulse 90  Temp(Src) 98.6 F (37 C) (Oral)  Resp 18  Ht 5\' 6"  (1.676 m)  Wt 164 lb (74.39 kg)  BMI 26.48 kg/m2  SpO2 100% Physical Exam  Nursing note and vitals reviewed. Constitutional: He appears well-developed and well-nourished.  HENT:  Head: Normocephalic.  Mouth/Throat: Oropharynx is clear and moist.  Eyes: EOM are normal. Right conjunctiva is injected. Left conjunctiva is injected.  Neck: Normal range of motion.  Cardiovascular: Normal rate.   Abdominal: Soft.  Lymphadenopathy:    He has no cervical adenopathy.  Neurological: He is alert.  Skin: Skin is warm. No rash noted. No erythema.  Patient has a well healed surgical scars on his left arm, status post dog  bite.  His most recent surgery of the left wrist shows a healing wound.  No erythema.    ED Course  Procedures (including critical care time) Labs Review Labs Reviewed  CBC - Abnormal; Notable for the following:    WBC 11.7 (*)    RBC 4.05 (*)    HCT 36.7 (*)    All other components within normal limits  BASIC METABOLIC PANEL - Abnormal; Notable for the following:    Sodium 134 (*)    Chloride 95 (*)    Glucose, Bld 102 (*)    All other components within normal limits  HIV ANTIBODY (ROUTINE TESTING) - Abnormal; Notable for the following:    HIV 1&2 Ab, 4th Generation Reactive (*)    All other components within normal limits  CBC - Abnormal; Notable for the following:    RBC 3.48 (*)    Hemoglobin 11.1 (*)    HCT 31.6 (*)    All other components within normal limits  HIV 1/2 CONFIRMATION, WESTERN BLOT - Abnormal; Notable for the following:    HIV-1 antibody POSITIVE (*)    All other components within normal limits  URINALYSIS, ROUTINE W REFLEX MICROSCOPIC  HEPATIC FUNCTION PANEL  BASIC METABOLIC PANEL  I-STAT TROPOININ, ED   Imaging Review No results found.   EKG Interpretation   Date/Time:  Friday April 16 2013 07:43:40 EDT Ventricular Rate:  110 PR Interval:  140 QRS Duration: 95 QT Interval:  339 QTC Calculation: 459 R Axis:   74 Text Interpretation:  Sinus tachycardia Borderline T wave abnormalities No  previous tracing Confirmed by Anitra LauthPLUNKETT  MD, WHITNEY ([id number]) on 04/16/2013  7:50:53 AM      MDM   Final diagnoses:  Pulmonary embolism  Chest pain        Arman FilterGail K Hershall Benkert, NP 04/21/13 2004

## 2013-04-16 NOTE — Progress Notes (Signed)
ANTICOAGULATION CONSULT NOTE - Initial Consult  Pharmacy Consult for Xarelto Indication: pulmonary embolus  No Known Allergies  Patient Measurements: Height: 5\' 6"  (167.6 cm) Weight: 164 lb (74.39 kg) IBW/kg (Calculated) : 63.8  Vital Signs: Temp: 99.8 F (37.7 C) (04/10 1348) Temp src: Oral (04/10 1348) BP: 132/69 mmHg (04/10 1348) Pulse Rate: 103 (04/10 1348)  Labs:  Recent Labs  04/16/13 0742  HGB 13.0  HCT 36.7*  PLT 252  CREATININE 0.78    Estimated Creatinine Clearance: 127.4 ml/min (by C-G formula based on Cr of 0.78).   Medical History: Past Medical History  Diagnosis Date  . No pertinent past medical history     Medications:  Prescriptions prior to admission  Medication Sig Dispense Refill  . amitriptyline (ELAVIL) 25 MG tablet Take 1 tablet (25 mg total) by mouth at bedtime as needed for sleep.  30 tablet  1  . sulfamethoxazole-trimethoprim (SEPTRA DS) 800-160 MG per tablet Take 2 tablets by mouth 2 (two) times daily.  28 tablet  0    Assessment: 28 yo M presented to ED with likely drug reaction to Septra (nausea, flushing, and itching).  While in the ED the patient developed chest and back pain, tachycardia, and diaphoresis.  CT chest showed PE.  Pt was given a dose of Xarelto in the ED at 1100 today.  Physician team was concerned about cost of Xarelto for this patient.  I informed them of a patient assistance program that was available and have asked case management to evaluate the patient for assistance.   Goal of Therapy:  Therapeutic Anticoagulation Monitor platelets by anticoagulation protocol: Yes   Plan:  Xarelto 15mg  BID x 21 days, then Xarelto 20mg  daily. Xarelto education completed with patient.  Toys 'R' UsKimberly Tashiba Timoney, Pharm.D., BCPS Clinical Pharmacist Pager [phone] 04/16/2013 4:18 PM

## 2013-04-16 NOTE — H&P (Signed)
Internal Medicine Attending Admission Note Date: 04/16/2013  Patient name: Darin ArenaChristopher Morris Medical record number: [id number] Date of birth: 02-10-1985 Age: 28 y.o. Gender: male  I saw and evaluated the patient. I reviewed the resident's note and I agree with the resident's findings and plan as documented in the resident's note, with the following additional comments.  Chief Complaint(s): Pruritus; acute chest pain and shortness of breath  History - key components related to admission: Patient is a 28 year old man with history of left wrist laceration in July of 2014 with tendon and nerve involvement, status post left wrist ulnar nerve repair, ulnar artery repair, and multiple tendon repairs (see operative note dated 08/12/2012 by Dr. Melvyn Novasrtmann), seen in urgent care 03/27/2013 with pain in the area of the surgical scar and some drainage where a suture had erupted through the skin.  The suture was removed in urgent care and sent for culture, and patient was started on empiric Septra DS and amitriptyline for neuropathic pain; patient was advised to follow up with Dr. Merlyn LotKuzma.  The culture grew methicillin-sensitive Staphylococcus aureus.  Patient reports that he took the Septra DS but developed nausea, flushing, itching, and a feeling that his stomach was swelling, so he presented to the ED.  In the ED he developed chest pain and back pain, tachycardia, and diaphoresis; a CT angiogram of the chest showed a pulmonary embolism, and so patient was admitted to the hospital.  doxycycline Physical Exam - key components related to admission:  Filed Vitals:   04/16/13 1150 04/16/13 1200 04/16/13 1230 04/16/13 1348  BP: 116/70  121/62 132/69  Pulse:  96 102 103  Temp:    99.8 F (37.7 C)  TempSrc:    Oral  Resp: 15 17 18    Height:    5\' 6"  (1.676 m)  Weight:      SpO2:  100% 100% 99%    General: Alert, oriented Lungs: Clear Heart: S1-S2, no S3, no S4, no murmurs Abdomen: Bowel sounds present, soft,  nontender Extremities: No edema; no calf tenderness; negative Homans.  There is a well-healed surgical scar on the left wrist with no apparent fluctuance or discharge. Skin: Skin is mildly flushed, warm, with mild excoriations on both lower extremities  Lab results:   Basic Metabolic Panel:  Recent Labs  78/29/5602/10/22 0742  NA 134*  K 3.8  CL 95*  CO2 24  GLUCOSE 102*  BUN 9  CREATININE 0.78  CALCIUM 8.8    Liver Function Tests:  Recent Labs  04/16/13 0742  AST 30  ALT 28  ALKPHOS 108  BILITOT 0.3  PROT 7.5  ALBUMIN 3.7     CBC:  Recent Labs  04/16/13 0742  WBC 11.7*  HGB 13.0  HCT 36.7*  MCV 90.6  PLT 252    Urinalysis    Component Value Date/Time   COLORURINE YELLOW 04/16/2013 1323   APPEARANCEUR CLEAR 04/16/2013 1323   LABSPEC 1.019 04/16/2013 1323   PHURINE 6.5 04/16/2013 1323   GLUCOSEU NEGATIVE 04/16/2013 1323   HGBUR NEGATIVE 04/16/2013 1323   BILIRUBINUR NEGATIVE 04/16/2013 1323   KETONESUR NEGATIVE 04/16/2013 1323   PROTEINUR NEGATIVE 04/16/2013 1323   UROBILINOGEN 0.2 04/16/2013 1323   NITRITE NEGATIVE 04/16/2013 1323   LEUKOCYTESUR NEGATIVE 04/16/2013 1323     Imaging results:  Dg Chest 2 View  04/16/2013   CLINICAL DATA:  Pain and shortness of breath.  EXAM: CHEST  2 VIEW  COMPARISON:  None.  FINDINGS: No edema or consolidation. No effusion or  pneumothorax. Question small calcified granulomas in the peripheral left lower and upper lung. No acute osseous findings. Normal heart size and mediastinal contours.  IMPRESSION: No active cardiopulmonary disease.   Electronically Signed   By: Tiburcio Pea M.D.   On: 04/16/2013 06:46   Ct Angio Chest Pe W/cm &/or Wo Cm  04/16/2013   CLINICAL DATA:  Shortness of breath and chest pain  EXAM: CT ANGIOGRAPHY CHEST WITH CONTRAST  TECHNIQUE: Multidetector CT imaging of the chest was performed using the standard protocol during bolus administration of intravenous contrast. Multiplanar CT image reconstructions and  MIPs were obtained to evaluate the vascular anatomy.  CONTRAST:  75mL OMNIPAQUE IOHEXOL 350 MG/ML SOLN  COMPARISON:  Chest radiograph April 16, 2013  FINDINGS: There is a small incompletely obstructing pulmonary embolus in a superior segment left lower lobe pulmonary artery branch. This small pulmonary embolus is seen on axial slice 49 series 4 and is confirmed on coronal slice 73 series 7 and sagittal slice 106 series 9. No other pulmonary embolus seen. No larger vessel pulmonary embolus in particular. No evidence of thoracic aortic aneurysm or dissection.  The lungs are clear. There is no appreciable thoracic adenopathy. Pericardium is not thickened.  Visualized upper abdominal structures appear normal. No blastic or lytic bone lesions are identified.  Review of the MIP images confirms the above findings.  IMPRESSION: Small incompletely obstructing pulmonary embolus in a branch of the superior segment left lower lobe pulmonary artery. No larger vessel pulmonary embolus. Lungs clear.  Critical Value/emergent results were called by telephone at the time of interpretation on 04/16/2013 at 9:34 AM to [facility], [place], who verbally acknowledged these results.   Electronically Signed   By: Bretta Bang M.D.   On: 04/16/2013 09:34    Other results: EKG: Sinus tachycardia; borderline T wave abnormalities  Assessment & Plan by Problem:  1.  Pulmonary embolism.  Patient presents with first episode of unprovoked pulmonary embolism.  He has no prior history of venous thromboembolism and no apparent risk factors.  Plan is heparin with overlap and transition to warfarin; monitor; IV fluids; symptomatic treatment of pain; will need hypercoagulability workup.  2.  Allergic drug reaction.  Patient presents with flushing, itching, and other symptoms that likely represent an allergic reaction to the trimethoprim-sulfamethoxazole, although possibly to the amitriptyline.  He received diphenhydramine, prednisone 60 mg,  and famotidine 20 mg in the ED.  Plan is stop trimethoprim-sulfamethoxazole; stop amitriptyline; check liver panel; symptomatic treatment with diphenhydramine (Benadryl).   3.  Recent methicillin-sensitive Staph aureus cultured from a suture which extruded from healed left wrist surgical scar.  Exam does not show warmth, fluctuance or drainage.  Patient was treated with trimethoprim-sulfamethoxazole; need to clarify the duration of treatment, because it appears that he was prescribed a one-week supply on 03/27/2013.  Plan is follow exam closely but not add a different antibiotic at this time unless signs or symptoms of infection develop.    4.  Other problems and plans as per the resident physician's note.

## 2013-04-16 NOTE — ED Notes (Signed)
Pt voices concern for HIV and Trich, sts his girlfriend for 1 month told him she had a strain of HIV.

## 2013-04-16 NOTE — ED Notes (Signed)
Pt reports he started taking new medications three weeks ago, pt reports he had surgery last August and has had an infection at the site of surgery (left wrist) since then.

## 2013-04-16 NOTE — ED Provider Notes (Signed)
6:[facility] Patient with allergic reaction after being on septra for the past several weeks. Also c/o CP with deep breath. Pending CXR.   If neg and patient stable, can d/c home with typical allergy regimen.   7:37 AM Patient examined.   Upon my arrival into room, patient very uncomfortable appearing. He is diaphoretic and tachycardic. He is complaining of SOB but does not appear in respiratory distress. He c/o mid chest pain worse with palpation and deep breathing as well as R lateral chest pain/lower back pain. Pt states pain began after arrival. No h/o blood clots. Denies cocaine use.   CXR is clear. Given patient appearance, will need labs/EKG.   7:52 AM EKG reviewed.   9:53 AM Spoke with radiologist regarding CT findings of small peripheral PE.   Patient now flushed, continues to be tachycardic, oral temp 99.61F. Given persistent tachycardia, new albeit small unprovoked PE, allergic symptoms, poor follow-up -- will admit.   I have spoken with IMTS who will see shortly. Holding orders written.     Renne CriglerJoshua Shelbe Haglund, PA-C 04/16/13 [phone]

## 2013-04-16 NOTE — ED Provider Notes (Signed)
Medical screening examination/treatment/procedure(s) were performed by non-physician practitioner and as supervising physician I was immediately available for consultation/collaboration.   EKG Interpretation   Date/Time:  Friday April 16 2013 07:43:40 EDT Ventricular Rate:  110 PR Interval:  140 QRS Duration: 95 QT Interval:  339 QTC Calculation: 459 R Axis:   74 Text Interpretation:  Sinus tachycardia Borderline T wave abnormalities No  previous tracing Confirmed by Anitra LauthPLUNKETT  MD, WHITNEY ([id number]) on 04/16/2013  7:50:53 AM       Darin Mackielga M Gionna Polak, MD 04/16/13 2043

## 2013-04-16 NOTE — ED Notes (Signed)
Per EMS, pt was taking an antibiotic for an infection in his wrist.  Now pt has been complaining of itching and burning for three weeks.

## 2013-04-16 NOTE — H&P (Signed)
Date: 04/16/2013               Patient Name:  Darin Morris MRN: [id number]  DOB: [birth year] Age / Sex: 28 y.o., male   PCP: No Pcp Per Patient         Medical Service: Internal Medicine Teaching Service         Attending Physician: Dr. Farley Ly, MD    First Contact: Dr. Claudell Kyle Pager: [phone]  Second Contact: Dr. Sherrine Maples Pager: [phone]       After Hours (After 5p/  First Contact Pager: [phone]  weekends / holidays): Second Contact Pager: [phone]   Chief Complaint: itching, chest pain  History of Present Illness:  The patient is a 28 YO man with only past medical significant for injury to left wrist s/p 2 surgeries to repair some tendon and nerve damage about 1 year ago. He was seen at urgent care about 2.5 weeks ago with complaints of tingling in hand and some drainage from surgical scar. He was prescribed bactrim and elavil which he started taking. He feels the drainage and redness have decreased since that time but he started feeling weird about 1 week ago. He started feeling a little nauseous with taking the bactrim but kept taking it for his arm. Yesterday when he took the medicine he felt like his stomach was swelling and itching. He was nauseous and threw up at home. He then called EMS to come into the ED and was being treated for allergic reaction. He was feeling a little better but still itching when he developed chest pain and back pain in the ED. He was having high heart rate and sweating. He got CTA chest and was found to have PE. Due to his persistent pain and tachycardia he was referred for admission. He does not have a family history of blood clots (he states one cousin died of lung clot s/p MVA however). He has been moving around as usual recently and has not been confined to bed or couch. He has not been traveling recently. His surgery on his hand most recently was about 9 months ago. He does smoke occasionally although about 1/2 PPD. He does use social alcohol  although has abstained while taking the antibiotics. He does not do drugs although occasional social marijuana. No fevers or chills at home. No abdominal pain or dysuria. No diarrhea or constipation. No leg swelling or pain. He is having a headache right now.  Meds: Current Facility-Administered Medications  Medication Dose Route Frequency Provider Last Rate Last Dose  . diphenhydrAMINE (BENADRYL) injection 12.5 mg  12.5 mg Intravenous Once [employer], PA-C      . morphine 4 MG/ML injection 4 mg  4 mg Intravenous Once Renne Crigler, PA-C      . ondansetron [facility]) injection 4 mg  4 mg Intravenous Once Renne Crigler, PA-C      . Rivaroxaban (XARELTO) tablet 15 mg  15 mg Oral Once Renne Crigler, PA-C      . sodium chloride 0.9 % bolus 1,000 mL  1,000 mL Intravenous Once Renne Crigler, PA-C 1,000 mL/hr at 04/16/13 0943 1,000 mL at 04/16/13 5284   Current Outpatient Prescriptions  Medication Sig Dispense Refill  . amitriptyline (ELAVIL) 25 MG tablet Take 1 tablet (25 mg total) by mouth at bedtime as needed for sleep.  30 tablet  1  . sulfamethoxazole-trimethoprim (SEPTRA DS) 800-160 MG per tablet Take 2 tablets by mouth 2 (two) times daily.  28 tablet  0  . [DISCONTINUED] promethazine (PHENERGAN) 25 MG tablet Take 1 tablet (25 mg total) by mouth every 6 (six) hours as needed for nausea.  12 tablet  0    Allergies: Allergies as of 04/16/2013  . (No Known Allergies)   Past Medical History  Diagnosis Date  . No pertinent past medical history    Past Surgical History  Procedure Laterality Date  . No past surgeries    . I&d extremity  07/29/2011    Procedure: IRRIGATION AND DEBRIDEMENT EXTREMITY;  Surgeon: Sharma CovertFred W Ortmann, MD;  [facility];  Service: Orthopedics;  Laterality: Left;  . Incision and drainage of wound  07/29/2011    Procedure: IRRIGATION AND DEBRIDEMENT WOUND;  Surgeon: Sharma CovertFred W Ortmann, MD;  Location: [facility] OR;  Service: Orthopedics;  Laterality: Left;  . Knee surgery      Hx:  of microscopic  . Wound exploration Left 08/12/2012    Procedure: LEFT HAND OPEN WOUND EXPLORATION;  Surgeon: Sharma CovertFred W Ortmann, MD;  Location: [facility] OR;  Service: Orthopedics;  Laterality: Left;  . Nerve and tendon repair Left 08/12/2012    Procedure: WITH NERVE AND TENDON REPAIR;  Surgeon: Sharma CovertFred W Ortmann, MD;  Location: [facility] OR;  Service: Orthopedics;  Laterality: Left;   No family history on file. History   Social History  . Marital Status: Single    Spouse Name: N/A    Number of Children: N/A  . Years of Education: N/A   Occupational History  . Not on file.   Social History Main Topics  . Smoking status: Current Every Day Smoker -- 0.25 packs/day for 4 years    Types: Cigarettes  . Smokeless tobacco: Never Used  . Alcohol Use: 6.0 oz/week    6 Cans of beer, 4 Shots of liquor per week  . Drug Use: No  . Sexual Activity: Yes    Birth Control/ Protection: Condom, Other-see comments     Comment: still smoke marijuana   Other Topics Concern  . Not on file   Social History Narrative  . No narrative on file    Review of Systems: A comprehensive 10 point review of systems was performed and other than pertinent positives and negatives listed above in HPI was negative.   Physical Exam: Blood pressure 120/65, pulse 52, temperature 99.6 F (37.6 C), temperature source Oral, resp. rate 18, weight 164 lb (74.39 kg), SpO2 98.00%. At exam HR 110-120 on monitor. General: resting in bed, lying in the dark with towel over his eyes HEENT: PERRL, EOMI, no scleral icterus Cardiac: fast, S1 S2 normal without murmur Pulm: clear to auscultation bilaterally, moving normal volumes of air, pluritic pain on inspiration, minimal chest wall tenderness Abd: soft, nontender, nondistended, BS present Ext: warm and well perfused, no pedal edema, no calf tenderness or swelling, 2+ pulse in radial left and right hand Neuro: alert and oriented X3, cranial nerves II-XII grossly intact, unable to make tight fist on  the left s/p tendon damage  Lab results: Basic Metabolic Panel:  Recent Labs  16/10/9602/10/15 0742  NA 134*  K 3.8  CL 95*  CO2 24  GLUCOSE 102*  BUN 9  CREATININE 0.78  CALCIUM 8.8   Liver Function Tests: No results found for this basename: AST, ALT, ALKPHOS, BILITOT, PROT, ALBUMIN,  in the last 72 hours No results found for this basename: LIPASE, AMYLASE,  in the last 72 hours No results found for this basename: AMMONIA,  in the last 72 hours CBC:  Recent Labs  04/16/13 0742  WBC 11.7*  HGB 13.0  HCT 36.7*  MCV 90.6  PLT 252   Cardiac Enzymes: No results found for this basename: CKTOTAL, CKMB, CKMBINDEX, TROPONINI,  in the last 72 hours  Imaging results:  Dg Chest 2 View  04/16/2013   CLINICAL DATA:  Pain and shortness of breath.  EXAM: CHEST  2 VIEW  COMPARISON:  None.  FINDINGS: No edema or consolidation. No effusion or pneumothorax. Question small calcified granulomas in the peripheral left lower and upper lung. No acute osseous findings. Normal heart size and mediastinal contours.  IMPRESSION: No active cardiopulmonary disease.   Electronically Signed   By: Tiburcio Pea M.D.   On: 04/16/2013 06:46   Ct Angio Chest Pe W/cm &/or Wo Cm  04/16/2013   CLINICAL DATA:  Shortness of breath and chest pain  EXAM: CT ANGIOGRAPHY CHEST WITH CONTRAST  TECHNIQUE: Multidetector CT imaging of the chest was performed using the standard protocol during bolus administration of intravenous contrast. Multiplanar CT image reconstructions and MIPs were obtained to evaluate the vascular anatomy.  CONTRAST:  75mL OMNIPAQUE IOHEXOL 350 MG/ML SOLN  COMPARISON:  Chest radiograph April 16, 2013  FINDINGS: There is a small incompletely obstructing pulmonary embolus in a superior segment left lower lobe pulmonary artery branch. This small pulmonary embolus is seen on axial slice 49 series 4 and is confirmed on coronal slice 73 series 7 and sagittal slice 106 series 9. No other pulmonary embolus seen. No  larger vessel pulmonary embolus in particular. No evidence of thoracic aortic aneurysm or dissection.  The lungs are clear. There is no appreciable thoracic adenopathy. Pericardium is not thickened.  Visualized upper abdominal structures appear normal. No blastic or lytic bone lesions are identified.  Review of the MIP images confirms the above findings.  IMPRESSION: Small incompletely obstructing pulmonary embolus in a branch of the superior segment left lower lobe pulmonary artery. No larger vessel pulmonary embolus. Lungs clear.  Critical Value/emergent results were called by telephone at the time of interpretation on 04/16/2013 at 9:34 AM to [facility], PA, who verbally acknowledged these results.   Electronically Signed   By: Bretta Bang M.D.   On: 04/16/2013 09:34    Other results: EKG: there are no previous tracings available for comparison, sinus tachycardia.  Assessment & Plan by Problem:  Pulmonary embolism - Unclear etiology, no stasis or recent surgery. No real family history of clotting or bleeding disorder. No clinical signs of DVT. Would recommend 6 months anticoagulation and then coagulation studies 2-4 weeks after stopping warfarin. Starting heparin drip. ED had ordered xarelto but given the patient does not have insurance he is unlikely to be able to afford this. Will initiate warfarin per pharmacy, would be good candidate to join our clinic and follow with coumadin clinic.  -Heparin drip -Observe on telemetry -Fluids for hydration -Morphine for pain -Warfarin per pharmacy  Tachycardia - Likely from the PE but will hydrate and watch closely given the drug reaction. -NS @ 125 cc/hr -Observe on telemetry  Adverse drug effect - Patient with itching from the bactrim likely (over the elavil). Got benadryl, pepcid, prednisone in the ED. Will continue with benadryl and zofran for nausea if needed. Checking LFTs for systemic cause of the itching.  -LFTs -Benadryl prn -Zofran  prn -Monitor for resolution  DVT ppx - On heparin drip  Dispo: Disposition is deferred at this time, awaiting improvement of current medical problems. Anticipated discharge in approximately 1-2 day(s).  The patient does not have a current PCP (No Pcp Per Patient) and does need an [facility] hospital follow-up appointment after discharge.  The patient does not have transportation limitations that hinder transportation to clinic appointments.  Signed: Judie Bonus, MD 04/16/2013, 10:26 AM

## 2013-04-16 NOTE — Discharge Planning (Signed)
[facility] Felicia E, [employer] Liaison  Spoke to patient about primary care resources and establishing care with a provider. Patient was given a resource guide and the orange card application. Patient was instructed to call me once the application was completed. My contact information was also provided.

## 2013-04-17 DIAGNOSIS — B20 Human immunodeficiency virus [HIV] disease: Secondary | ICD-10-CM

## 2013-04-17 LAB — BASIC METABOLIC PANEL
BUN: 9 mg/dL (ref 6–23)
CALCIUM: 8.6 mg/dL (ref 8.4–10.5)
CO2: 24 mEq/L (ref 19–32)
Chloride: 101 mEq/L (ref 96–112)
Creatinine, Ser: 0.88 mg/dL (ref 0.50–1.35)
GLUCOSE: 93 mg/dL (ref 70–99)
POTASSIUM: 4 meq/L (ref 3.7–5.3)
SODIUM: 138 meq/L (ref 137–147)

## 2013-04-17 LAB — CBC
HCT: 31.6 % — ABNORMAL LOW (ref 39.0–52.0)
Hemoglobin: 11.1 g/dL — ABNORMAL LOW (ref 13.0–17.0)
MCH: 31.9 pg (ref 26.0–34.0)
MCHC: 35.1 g/dL (ref 30.0–36.0)
MCV: 90.8 fL (ref 78.0–100.0)
Platelets: 223 10*3/uL (ref 150–400)
RBC: 3.48 MIL/uL — ABNORMAL LOW (ref 4.22–5.81)
RDW: 12.7 % (ref 11.5–15.5)
WBC: 7.4 10*3/uL (ref 4.0–10.5)

## 2013-04-17 LAB — HIV ANTIBODY (ROUTINE TESTING W REFLEX): HIV 1&2 Ab, 4th Generation: REACTIVE — AB

## 2013-04-17 MED ORDER — OXYCODONE-ACETAMINOPHEN 5-325 MG PO TABS: 1.0000 | ORAL_TABLET | Freq: Four times a day (QID) | ORAL | Status: AC | PRN

## 2013-04-17 MED ORDER — RIVAROXABAN 20 MG PO TABS: 20.0000 mg | ORAL_TABLET | Freq: Every day | ORAL | Status: AC

## 2013-04-17 MED ORDER — RIVAROXABAN 15 MG PO TABS: 15.0000 mg | ORAL_TABLET | Freq: Two times a day (BID) | ORAL | Status: AC

## 2013-04-17 NOTE — Discharge Summary (Signed)
Name: Darin Morris MRN: [id number] DOB: 1985-03-21 28 y.o. PCP: No Pcp Per Patient  Date of Admission: 04/16/2013  4:13 AM Date of Discharge: 04/17/2013 Attending Physician: Dr. Margarito LinerJerry Joines  Discharge Diagnosis: Principal Problem:   Pulmonary embolism Active Problems:   Tachycardia   Adverse drug effect  Discharge Medications:   Medication List    STOP taking these medications       amitriptyline 25 MG tablet  Commonly known as:  ELAVIL     sulfamethoxazole-trimethoprim 800-160 MG per tablet  Commonly known as:  SEPTRA DS      TAKE these medications       oxyCODONE-acetaminophen 5-325 MG per tablet  Commonly known as:  PERCOCET/ROXICET  Take 1 tablet by mouth every 6 (six) hours as needed for moderate pain or severe pain.     Rivaroxaban 15 MG Tabs tablet  Commonly known as:  XARELTO  Take 1 tablet (15 mg total) by mouth 2 (two) times daily with a meal.     Rivaroxaban 20 MG Tabs tablet  Commonly known as:  XARELTO  Take 1 tablet (20 mg total) by mouth daily with supper.  Start taking on:  05/07/2013        Disposition and follow-up:   [facility]Darin Morris [facility].  At the hospital follow up visit please address:  1.  Continued resolution of chest pain, back pain, and SOB associated with his PE.       He will need Orthopedic follow up at reevalute his wrist incision as he was has previously had sutures erupting through the skin- the incision was c/d/i w/ erythema, edema, or fluctuance this admission.      Also, consider STI w/u as an outpatient. Pt with risky sexual behavior but asymptomatic. 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 Unfortunately, he was mistakenly notified that it the result was negative before the results had returned. After multiple attempts to contact the patient, contact was made on 4/12 and made aware of his status. He will need a CD4, viral load, and to be set up  with Infectious Disease.  2.  Labs / imaging needed at time of follow-up: None  3.  Pending labs/ test needing follow-up: None   Follow-up Appointments: Follow-up Information   [facility] Schedule an appointment as soon as possible for a visit in 1 week.   Specialty:  Internal Medicine   Contact information:   [address] [id number] [place] [id number] [phone]      Follow up with Follow up with your orthopedist. (For wound re-check)        Discharge Instructions: Discharge Orders   Future Orders Complete By Expires   Activity as tolerated - No restrictions  As directed    Call MD for:  difficulty breathing, headache or visual disturbances  As directed    Call MD for:  persistant nausea and vomiting  As directed    Call MD for:  severe uncontrolled pain  As directed    Diet general  As directed    Discharge instructions  As directed       Consultations:  None  Procedures Performed:  Dg Chest 2 View  04/16/2013   CLINICAL DATA:  Pain and shortness of breath.  EXAM: CHEST  2 VIEW  COMPARISON:  None.  FINDINGS: No edema or consolidation. No effusion or pneumothorax. Question small calcified granulomas in the peripheral  left lower and upper lung. No acute osseous findings. Normal heart size and mediastinal contours.  IMPRESSION: No active cardiopulmonary disease.   Electronically Signed   By: Tiburcio Pea M.D.   On: 04/16/2013 06:46   Ct Angio Chest Pe W/cm &/or Wo Cm  04/16/2013   CLINICAL DATA:  Shortness of breath and chest pain  EXAM: CT ANGIOGRAPHY CHEST WITH CONTRAST  TECHNIQUE: Multidetector CT imaging of the chest was performed using the standard protocol during bolus administration of intravenous contrast. Multiplanar CT image reconstructions and MIPs were obtained to evaluate the vascular anatomy.  CONTRAST:  75mL OMNIPAQUE IOHEXOL 350 MG/ML SOLN  COMPARISON:  Chest radiograph April 16, 2013  FINDINGS: There  is a small incompletely obstructing pulmonary embolus in a superior segment left lower lobe pulmonary artery branch. This small pulmonary embolus is seen on axial slice 49 series 4 and is confirmed on coronal slice 73 series 7 and sagittal slice 106 series 9. No other pulmonary embolus seen. No larger vessel pulmonary embolus in particular. No evidence of thoracic aortic aneurysm or dissection.  The lungs are clear. There is no appreciable thoracic adenopathy. Pericardium is not thickened.  Visualized upper abdominal structures appear normal. No blastic or lytic bone lesions are identified.  Review of the MIP images confirms the above findings.  IMPRESSION: Small incompletely obstructing pulmonary embolus in a branch of the superior segment left lower lobe pulmonary artery. No larger vessel pulmonary embolus. Lungs clear.  Critical Value/emergent results were called by telephone at the time of interpretation on 04/16/2013 at 9:34 AM to [facility], who verbally acknowledged these results.   Electronically Signed   By: Bretta Bang M.D.   On: 04/16/2013 09:34    Admission HPI:  History of Present Illness:  The patient is a 28 YO man with only past medical significant for injury to left wrist s/p 2 surgeries to repair some tendon and nerve damage about 1 year ago. He was seen at urgent care about 2.5 weeks ago with complaints of tingling in hand and some drainage from surgical scar. He was prescribed bactrim and elavil which he started taking. He feels the drainage and redness have decreased since that time but he started feeling weird about 1 week ago. He started feeling a little nauseous with taking the bactrim but kept taking it for his arm. Yesterday when he took the medicine he felt like his stomach was swelling and itching. He was nauseous and threw up at home. He then called EMS to come into the ED and was being treated for allergic reaction. He was feeling a little better but still itching when  he developed chest pain and back pain in the ED. He was having high heart rate and sweating. He got CTA chest and was found to have PE. Due to his persistent pain and tachycardia he was referred for admission. He does not have a family history of blood clots (he states one cousin died of lung clot s/p MVA however). He has been moving around as usual recently and has not been confined to bed or couch. He has not been traveling recently. His surgery on his hand most recently was about 9 months ago. He does smoke occasionally although about 1/2 PPD. He does use social alcohol although has abstained while taking the antibiotics. He does not do drugs although occasional social marijuana. No fevers or chills at home. No abdominal pain or dysuria. No diarrhea or constipation. No leg swelling or pain.  He is having a headache right now.  Review of Systems:  A comprehensive 10 point review of systems was performed and other than pertinent positives and negatives listed above in HPI was negative.   Physical Exam:  Blood pressure 120/65, pulse 52, temperature 99.6 F (37.6 C), temperature source Oral, resp. rate 18, weight 164 lb (74.39 kg), SpO2 98.00%. At exam HR 110-120 on monitor.  General: resting in bed, lying in the dark with towel over his eyes  HEENT: PERRL, EOMI, no scleral icterus  Cardiac: fast, S1 S2 normal without murmur  Pulm: clear to auscultation bilaterally, moving normal volumes of air, pluritic pain on inspiration, minimal chest wall tenderness  Abd: soft, nontender, nondistended, BS present  Ext: warm and well perfused, no pedal edema, no calf tenderness or swelling, 2+ pulse in radial left and right hand  Neuro: alert and oriented X3, cranial nerves II-XII grossly intact, unable to make tight fist on the left s/p tendon damage   Hospital Course by problem list: Pt is a 28yo M with PMH left wrist tendon repair who presents with acute onset N/V and chest pain in the setting of an antibiotic  drug reaction and 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   #Pulmonary emboli, unprovoked: Pt presented with acute pleuritic chest and back pain in the setting of Bactrim use and was found to be tachycardic and diaphoretic in the ED. He a CTA was performed which showed a small embolus in the superior segment of the LLL pulmonary artery. Patient without known risk factors for blood clots: No known coagulopathies (no known personal or family hx of clotting or cancer), no recent venous stasis (no recent long-distance travel, flights, or immobilization), no known endothelial damage (no recent trauma or surgeries), and no signs/symptoms of a DVT.  Heparin was initiated and he was subsequently started on Xarelto. He does not have insurance, but was given a card to get the Xarelto for free for 1 month; she also gave him information on how to then receive free Xarelto from the manufacturer for the remainder of his therapy. Because he was started on Heparin on admission, we did not check a hypercoagulability panel, but given that he has no risk factors for blood clots (except for smoking an occasional cigarette) and no family history, this will be good to perform in the outpatient setting.  - Continue Xarelto 15mg  BID for 21 days; he has received 2 doses.  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 He stated that he was notifed by a former partener that she was HIV positive. HIV checked here is positive. Results returned after discharge. Western blot positive for HIV-1, HIV-2 Ab negative.  The patient was notified that his HIV was negative prior to discharge and safe sex practices were discussed in depth with him at that time. Unfortunately, the final HIV testing results did not return until after 5pm when the patient had already  left the hospital after discharge. After multiple attempts to contact the patient, contact was made on 4/12 and after providing proof of identification with his full name and birth date, his results were discussed with him over the phone.  He was very quiet throughout the discussion, with good reason. He did acknowledge understanding and agreed to call our clinic  in the morning to set up a follow up appointment. I told him that he needs to notify any partners that he has had unprotected intercourse with and tell them to get tested. I have messaged the clinic staff to call and set up a follow up appt with the patient for as soon as possible. Mr. Mimbs is to f/u in our clinic, and we will try to get him in as soon as possible (early this week), and from there he will be set up with [facility]. He will need CD4 and viral load checked.  #Adverse drug reaction: Pt was taking Bactrim for possible infection at his old surgical site where a suture erupted. This was started on 3/21 and was supposed to be for a total of 7 days. Not sure about his medication compliance, but regardless, he states that he was taking the Bactrim when he starting itching, vomited and began having chest and back pain. He received Benadryl, Pepcid, Prednisone, and Zofran in the ED. LFTs were normal to ensure no systemic cause of itching. Held abx on admission and discharge and will added Bactrim to his allergy list. He needs to f/u with his orthopedist for further evaluation of his wrist, which was w/ erythema, edema, or fluctuance and remained clean, dry, and intact this admission. All adverse drug reaction symptoms had resolved prior to d/c home.  #Tachycardia: Likely 2/2 PE. Tachycardic to 112; PE seen on CTA. Pt being anticoagulated with Xarelto. HR 90 this morning. This can be reassessed at his outpatient follow up.   Discharge Vitals:   BP 115/43  Pulse 90  Temp(Src) 98.6 F (37 C) (Oral)  Resp 18  Ht 5\' 6"  (1.676 m)  Wt 164 lb  (74.39 kg)  BMI 26.48 kg/m2  SpO2 100%  Discharge Labs:  Results for orders placed during the hospital encounter of 04/16/13 (from the past 24 hour(s))  BASIC METABOLIC PANEL     Status: None   Collection Time    04/17/13  4:19 AM      Result Value Ref Range   Sodium 138  137 - 147 mEq/L   Potassium 4.0  3.7 - 5.3 mEq/L   Chloride 101  96 - 112 mEq/L   CO2 24  19 - 32 mEq/L   Glucose, Bld 93  70 - 99 mg/dL   BUN 9  6 - 23 mg/dL   Creatinine, Ser 1.61  0.50 - 1.35 mg/dL   Calcium 8.6  8.4 - 09.6 mg/dL   GFR calc non Af Amer >90  >90 mL/min   GFR calc Af Amer >90  >90 mL/min  CBC     Status: Abnormal   Collection Time    04/17/13  4:19 AM      Result Value Ref Range   WBC 7.4  4.0 - 10.5 K/uL   RBC 3.48 (*) 4.22 - 5.81 MIL/uL   Hemoglobin 11.1 (*) 13.0 - 17.0 g/dL   HCT 04.5 (*) 40.9 - 81.1 %   MCV 90.8  78.0 - 100.0 fL   MCH 31.9  26.0 - 34.0 pg   MCHC 35.1  30.0 - 36.0 g/dL   RDW 91.4  78.2 - 95.6 %   Platelets 223  150 - 400 K/uL    Signed: Genelle Gather, MD 04/17/2013, 4:26 PM   Time Spent on Discharge: 35 minutes Services Ordered on Discharge: None Equipment Ordered on Discharge: None

## 2013-04-17 NOTE — Discharge Instructions (Signed)
Information on my medicine - XARELTO (rivaroxaban)  This medication education was reviewed with me or my healthcare representative as part of my discharge preparation.  The pharmacist that spoke with me during my hospital stay was:  Judie Bonus Kelleigh Skerritt, [facility]  WHY WAS XARELTO PRESCRIBED FOR YOU? Xarelto was prescribed to treat blood clots that may have been found in the veins of your legs (deep vein thrombosis) or in your lungs (pulmonary embolism) and to reduce the risk of them occurring again.  What do you need to know about Xarelto? The starting dose is one 15 mg tablet taken TWICE daily with food for the FIRST 21 DAYS then on (enter date)  May 07, 2013  the dose is changed to one 20 mg tablet taken ONCE A DAY with your evening meal.  DO NOT stop taking Xarelto without talking to the health care provider who prescribed the medication.  Refill your prescription for 20 mg tablets before you run out.  After discharge, you should have regular check-up appointments with your healthcare provider that is prescribing your Xarelto.  In the future your dose may need to be changed if your kidney function changes by a significant amount.  What do you do if you miss a dose? If you are taking Xarelto TWICE DAILY and you miss a dose, take it as soon as you remember. You may take two 15 mg tablets (total 30 mg) at the same time then resume your regularly scheduled 15 mg twice daily the next day.  If you are taking Xarelto ONCE DAILY and you miss a dose, take it as soon as you remember on the same day then continue your regularly scheduled once daily regimen the next day. Do not take two doses of Xarelto at the same time.   Important Safety Information Xarelto is a blood thinner medicine that can cause bleeding. You should call your healthcare provider right away if you experience any of the following:   Bleeding from an injury or your nose that does not stop.   Unusual colored urine (red or dark  brown) or unusual colored stools (red or black).   Unusual bruising for unknown reasons.   A serious fall or if you hit your head (even if there is no bleeding).  Some medicines may interact with Xarelto and might increase your risk of bleeding while on Xarelto. To help avoid this, consult your healthcare provider or pharmacist prior to using any new prescription or non-prescription medications, including herbals, vitamins, non-steroidal anti-inflammatory drugs (NSAIDs) and supplements.  This website has more information on Xarelto: VisitDestination.com.br. Pulmonary Embolus A pulmonary (lung) embolus (PE) is a blood clot that has traveled from another place in the body to the lung. Most clots come from deep veins in the legs or pelvis. PE is a dangerous and potentially life-threatening condition that can be treated if identified. CAUSES Blood clots form in a vein for different reasons. Usually several things cause blood clots. They include:  The flow of blood slows down.  The inside of the vein is damaged in some way.  The person has a condition that makes the blood clot more easily. These conditions may include:  Older age (especially over 50 years old).  Having a history of blood clots.  Having major or lengthy surgery. Hip surgery is particularly high-risk.  Breaking a hip or leg.  Sitting or lying still for a long time.  Cancer or cancer treatment.  Having a long, thin tube (catheter) placed inside  a vein during a medical procedure.  Being overweight (obese).  Pregnancy and childbirth.  Medicines with estrogen.  Smoking.  Other circulation or heart problems. SYMPTOMS  The symptoms of a PE usually start suddenly and include:  Shortness of breath.  Coughing.  Coughing up blood or blood-tinged mucus (phlegm).  Chest pain. Pain is often worse with deep breaths.  Rapid heartbeat. DIAGNOSIS  If a PE is suspected, your caregiver will take a medical history and carry out  a physical exam. Your caregiver will check for the risk factors listed above. Tests that also may be required include:  Blood tests, including studies of the clotting properties of your blood.  Imaging tests. Ultrasound, CT, MRI, and other tests can all be used to see if you have clots in your legs or lungs. If you have a clot in your legs and have breathing or chest problems, your caregiver may conclude that you have a clot in your lungs. Further lung tests may not be needed.  Electrocardiography can look for heart strain from blood clots in the lungs. PREVENTION   Exercise the legs regularly. Take a brisk 30 minute walk every day.  Maintain a weight that is appropriate for your height.  Avoid sitting or lying in bed for long periods of time without moving your legs.  Women, particularly those over the age of 26, should consider the risks and benefits of taking estrogen medicines, including birth control pills.  Do not smoke, especially if you take estrogen medicines.  Long-distance travel can increase your risk. You should exercise your legs by walking or pumping the muscles every hour.  In hospital prevention:  Your caregiver will assess your need for preventive PE care (prophylaxis) when you are admitted to the hospital. If you are having surgery, your surgeon will assess you the day of or day after surgery.  Prevention may include medical and nonmedical measures. TREATMENT   The most common treatment for a PE is blood thinning (anticoagulant) medicine, which reduces the blood's tendency to clot. Anticoagulants can stop new blood clots from forming and old ones from growing. They cannot dissolve existing clots. Your body does this by itself over time. Anticoagulants can be given by mouth, by intravenous (IV) access, or by injection. Your caregiver will determine the best program for you.  Less commonly, clot-dissolving drugs (thrombolytics) are used to dissolve a PE. They carry a  high risk of bleeding, so they are used mainly in severe cases.  Very rarely, a blood clot in the leg needs to be removed surgically.  If you are unable to take anticoagulants, your caregiver may arrange for you to have a filter placed in a main vein in your abdomen. This filter prevents clots from traveling to your lungs. HOME CARE INSTRUCTIONS   Take all medicines prescribed by your caregiver. Follow the directions carefully.  Warfarin. Most people will continue taking warfarin after hospital discharge. Your caregiver will advise you on the length of treatment (usually 3 6 months, sometimes lifelong).  Too much and too little warfarin are both dangerous. Too much warfarin increases the risk of bleeding. Too little warfarin continues to allow the risk for blood clots. While taking warfarin, you will need to have regular blood tests to measure your blood clotting time. These blood tests usually include both the prothrombin time (PT) and International Normalized Ratio (INR) tests. The PT and INR results allow your caregiver to adjust your dose of warfarin. The dose can change for many reasons.  It is critically important that you take warfarin exactly as prescribed, and that you have your PT and INR levels drawn exactly as directed.  Many foods, especially foods high in vitamin K can interfere with warfarin and affect the PT and INR results. Foods high in vitamin K include spinach, kale, broccoli, cabbage, collard and turnip greens, brussels sprouts, peas, cauliflower, seaweed, and parsley as well as beef and pork liver, green tea, and soybean oil. You should eat a consistent amount of foods high in vitamin K. Avoid major changes in your diet, or notify your caregiver before changing your diet. Arrange a visit with a dietitian to answer your questions.  Many medicines can interfere with warfarin and affect the PT and INR results. You must tell your caregiver about any and all medicines you take, this  includes all vitamins and supplements. Be especially cautious with aspirin and anti-inflammatory medicines. Ask your caregiver before taking these. Do not take or discontinue any prescribed or over-the-counter medicine except on the advice of your caregiver or pharmacist.  Warfarin can have side effects, such as excessive bruising or bleeding. You will need to hold pressure over cuts for longer than usual.  Alcohol can change the body's ability to handle warfarin. It is best to avoid alcoholic drinks or consume only very small amounts while taking warfarin. Notify your caregiver if you change your alcohol intake.  Notify your dentist or other caregivers before procedures.  Avoid contact sports.  Wear a medical alert bracelet or carry a medical alert card.  Ask your caregiver how soon you can go back to normal activities. Not being active can lead to new clots. Ask for a list of what you should and should not do.  Compression stockings. These are tight elastic stockings that apply pressure to the lower legs. This can help keep the blood in the legs from clotting. You may need to wear compressions stockings at home to help prevent clots.  Smoking. If you smoke, quit. Ask your caregiver for help with quitting smoking.  Learn as much as you can about PE. Educating yourself can help prevent PE from reoccurring. SEEK MEDICAL CARE IF:   You notice a rapid heartbeat.  You feel weaker or more tired than usual.  You feel faint.  You notice increased bruising.  Your symptoms are not getting better in the time expected.  You are having side effects of medicine. SEEK IMMEDIATE MEDICAL CARE IF:   You have chest pain.  You have trouble breathing.  You have new or increased swelling or pain in one leg.  You cough up blood.  You notice blood in vomit, in a bowel movement, or in urine.  You have an oral temperature above 102 F (38.9 C), not controlled by medicine. You may have another  PE. A blood clot in the lungs is a medical emergency. Call your local emergency services (911 in [place] to get to the nearest hospital or clinic. Do not drive yourself. MAKE SURE YOU:   Understand these instructions.  Will watch your condition.  Will get help right away if you are not doing well or get worse. Document Released: 12/22/1999 Document Revised: 06/25/2011 Document Reviewed: 06/27/2008 [facility] Patient Information 2014 [place], [place].

## 2013-04-17 NOTE — Progress Notes (Signed)
I have seen the patient and reviewed the daily progress note by Augustine RadarBrandon Mullins, MS 4 and discussed the care of the patient with him.  See below for documentation of my findings, assessment, and plans.  Subjective: Pt doing well this morning. He denies andy SOB or chest pain. He states that he is ready to go home. He does not have a PCP and would like to establish care in our clinic while he is here in [place]. He was given the 30-day free trial prescription for Xarelto and was given information by CM on how to get the Xarelto for free there after.   Objective: Vital signs in last 24 hours: Filed Vitals:   04/16/13 1230 04/16/13 1348 04/16/13 1941 04/17/13 0528  BP: 121/62 132/69 132/80 115/43  Pulse: 102 103 99 90  Temp:  99.8 F (37.7 C) 100.1 F (37.8 C) 98.6 F (37 C)  TempSrc:  Oral Oral Oral  Resp: 18  18 18   Height:  5\' 6"  (1.676 m)    Weight:      SpO2: 100% 99% 100% 100%   Weight change:   Intake/Output Summary (Last 24 hours) at 04/17/13 1113 Last data filed at 04/17/13 0940  Gross per 24 hour  Intake    720 ml  Output      0 ml  Net    720 ml   Vitals reviewed. General: Sitting in chair reading the paper, NAD HEENT: PERRL, EOMI Cardiac: Tachycardic but regular rhythm, no rubs, murmurs or gallops Pulm: Clear to auscultation bilaterally, no wheezes, rales, or rhonchi Abd: Soft, nontender, nondistended, BS present Ext: warm and well perfused, no peripheral edema Neuro: alert and oriented X3, cranial nerves II-XII grossly intact, strength and sensation are equal in bilateral upper and lower extremities. Left wrist with well healed surgical scar. No edema or erythema.   Lab Results: Reviewed and documented in Electronic Record Micro Results: Reviewed and documented in Electronic Record Studies/Results: Reviewed and documented in Electronic Record  Medications: I have reviewed the patient's current medications. Scheduled Meds: . rivaroxaban  15 mg Oral BID WC   Followed by  . [START ON 05/07/2013] rivaroxaban  20 mg Oral Q supper   Continuous Infusions: . sodium chloride 125 mL/hr at 04/17/13 0227   PRN Meds:.diphenhydrAMINE, morphine injection, ondansetron (ZOFRAN) IV, oxyCODONE-acetaminophen  Assessment/Plan: Pt is a 28yo M with PMH left wrist tendon repair who presents with acute onset N/V and chest pain in the setting of an antibiotic drug reaction.   #Pulmonary emboli, unprovoked: Pt with acute chest and back pain in the setting of abx use. He presented to the ED and was found to have a small embolus in the superior segment of the LLL pulmonary artery. Heparin was initiated and he was subsequently started on Xarelto. He does not have insurance, but was given a card to get the Xarelto for free for 1 month; she also gave him information on how to then receive free Xarelto from the manufacturer for the remainder of his therapy.  Because he was started on Heparin on admission, we did not check a hypercoagulability panel, but given that he has no risk factors for blood clots (except for smoking an occasional cigarette) and no family history, this will be good to perform in the outpatient setting. - Continue Xarelto 15mg  BID for 21 days; he has received 2 doses. - Then will need to transition to Xarelto 20mg  daily for 3-6 months - We will need to make sure that  he was able to get the Xarelto  #Adverse drug reaction: Pt was taking Bactrim for possible infection at his old surgical site where a suture erupted. This was started on 3/21 and was supposed to be for a total of 7 days. Not sure about his medication compliance, but regardless, he states that he was taking the Bactrim when he vomited and began having chest and back pain. Holding abx and will add Bactrim to his Allergy list.  #Tachycardia: Tachycardic to 112 in the ED. PE seen on CTA. Pt being anticoagulated with Xarelto. HR 90 this morning. This can be reassessed at his outpatient follow  up.    Dispo: D/c to home today  The patient does not have a current PCP (No Pcp Per Patient) and does need an [facility] hospital follow-up appointment after discharge.  The patient does have transportation limitations that hinder transportation to clinic appointments.  .Services Needed at time of discharge: Y = Yes, Blank = No PT:   OT:   RN:   Equipment:   Other:     LOS: 1 day   Genelle Gather, MD 04/17/2013, 11:13 AM

## 2013-04-17 NOTE — Progress Notes (Signed)
Subjective: Patient is feeling much better today. He has no has chest pain, SOB, or back pain. Overall no complaints.  He also states that he would like an outpatient follow-up next week in our clinic in order to get the results of his HIV test in addition to getting a full-STI panel.   Objective: Vital signs in last 24 hours: Filed Vitals:   04/16/13 1230 04/16/13 1348 04/16/13 1941 04/17/13 0528  BP: 121/62 132/69 132/80 115/43  Pulse: 102 103 99 90  Temp:  99.8 F (37.7 C) 100.1 F (37.8 C) 98.6 F (37 C)  TempSrc:  Oral Oral Oral  Resp: 18  18 18   Height:  5\' 6"  (1.676 m)    Weight:      SpO2: 100% 99% 100% 100%   Weight change:   Intake/Output Summary (Last 24 hours) at 04/17/13 0852 Last data filed at 04/16/13 1700  Gross per 24 hour  Intake    480 ml  Output      0 ml  Net    480 ml    Physical Exam: General appearance: Alert and oriented x3. Patient is lying comfortably and in good spirits. HEENT: Pupils are equal, round and reactive. Extra occular movements are intact. Conjunctivae clear. Mucous membranes are dry. Cardiovascular: Regular rate and rhythm, normal S1S2, no murmurs/rubs/gallops appreciated. Pulmonary: Normal work of breathing. Clear to auscultation bilaterally.  Abdomen: Normoactive bowel sounds. Soft, non-tender/non-distended. No organomegaly. Extremities: Warm and well-perfused. No peripheral edema noted. Pulses 2+ bilaterally. Scaring on left wrist.  Skin: Skin color, texture, turgor normal. No rashes or lesions.    Lab Results: BMET    Component Value Date/Time   NA 138 04/17/2013 0419   K 4.0 04/17/2013 0419   CL 101 04/17/2013 0419   CO2 24 04/17/2013 0419   GLUCOSE 93 04/17/2013 0419   BUN 9 04/17/2013 0419   CREATININE 0.88 04/17/2013 0419   CALCIUM 8.6 04/17/2013 0419   GFRNONAA >90 04/17/2013 0419   GFRAA >90 04/17/2013 0419   CBC    Component Value Date/Time   WBC 7.4 04/17/2013 0419   RBC 3.48* 04/17/2013 0419   HGB 11.1* 04/17/2013  0419   HCT 31.6* 04/17/2013 0419   PLT 223 04/17/2013 0419   MCV 90.8 04/17/2013 0419   MCH 31.9 04/17/2013 0419   MCHC 35.1 04/17/2013 0419   RDW 12.7 04/17/2013 0419   LYMPHSABS 3.1 07/29/2011 0540   MONOABS 0.9 07/29/2011 0540   EOSABS 0.1 07/29/2011 0540   BASOSABS 0.0 07/29/2011 0540   Urinalysis    Component Value Date/Time   COLORURINE YELLOW 04/16/2013 1323   APPEARANCEUR CLEAR 04/16/2013 1323   LABSPEC 1.019 04/16/2013 1323   PHURINE 6.5 04/16/2013 1323   GLUCOSEU NEGATIVE 04/16/2013 1323   HGBUR NEGATIVE 04/16/2013 1323   BILIRUBINUR NEGATIVE 04/16/2013 1323   KETONESUR NEGATIVE 04/16/2013 1323   PROTEINUR NEGATIVE 04/16/2013 1323   UROBILINOGEN 0.2 04/16/2013 1323   NITRITE NEGATIVE 04/16/2013 1323   LEUKOCYTESUR NEGATIVE 04/16/2013 1323   Troponin (Point of Care Test)  Recent Labs  04/16/13 0755  TROPIPOC 0.01   Hepatic Function Panel     Component Value Date/Time   PROT 7.5 04/16/2013 0742   ALBUMIN 3.7 04/16/2013 0742   AST 30 04/16/2013 0742   ALT 28 04/16/2013 0742   ALKPHOS 108 04/16/2013 0742   BILITOT 0.3 04/16/2013 0742   BILIDIR <0.2 04/16/2013 0742   IBILI NOT CALCULATED 04/16/2013 0742    Studies/Results: Dg Chest 2 View  04/16/2013   CLINICAL DATA:  Pain and shortness of breath.  EXAM: CHEST  2 VIEW  COMPARISON:  None.  FINDINGS: No edema or consolidation. No effusion or pneumothorax. Question small calcified granulomas in the peripheral left lower and upper lung. No acute osseous findings. Normal heart size and mediastinal contours.  IMPRESSION: No active cardiopulmonary disease.   Electronically Signed   By: Tiburcio Pea M.D.   On: 04/16/2013 06:46   Ct Angio Chest Pe W/cm &/or Wo Cm  04/16/2013   CLINICAL DATA:  Shortness of breath and chest pain  EXAM: CT ANGIOGRAPHY CHEST WITH CONTRAST  TECHNIQUE: Multidetector CT imaging of the chest was performed using the standard protocol during bolus administration of intravenous contrast. Multiplanar CT image  reconstructions and MIPs were obtained to evaluate the vascular anatomy.  CONTRAST:  75mL OMNIPAQUE IOHEXOL 350 MG/ML SOLN  COMPARISON:  Chest radiograph April 16, 2013  FINDINGS: There is a small incompletely obstructing pulmonary embolus in a superior segment left lower lobe pulmonary artery branch. This small pulmonary embolus is seen on axial slice 49 series 4 and is confirmed on coronal slice 73 series 7 and sagittal slice 106 series 9. No other pulmonary embolus seen. No larger vessel pulmonary embolus in particular. No evidence of thoracic aortic aneurysm or dissection.  The lungs are clear. There is no appreciable thoracic adenopathy. Pericardium is not thickened.  Visualized upper abdominal structures appear normal. No blastic or lytic bone lesions are identified.  Review of the MIP images confirms the above findings.  IMPRESSION: Small incompletely obstructing pulmonary embolus in a branch of the superior segment left lower lobe pulmonary artery. No larger vessel pulmonary embolus. Lungs clear.  Critical Value/emergent results were called by telephone at the time of interpretation on 04/16/2013 at 9:34 AM to [facility], PA, who verbally acknowledged these results.   Electronically Signed   By: Bretta Bang M.D.   On: 04/16/2013 09:34   Medications: I have reviewed the patient's current medications. Scheduled Meds: . rivaroxaban  15 mg Oral BID WC   Followed by  . [START ON 05/07/2013] rivaroxaban  20 mg Oral Q supper   Continuous Infusions: . sodium chloride 125 mL/hr at 04/17/13 0227   PRN Meds:.diphenhydrAMINE, morphine injection, ondansetron (ZOFRAN) IV, oxyCODONE-acetaminophen   Assessment/Plan: Pulmonary Embolism - Patient complained of sudden-onset pleuritic chest pain, SOB, tachycardia, and diaphoresis in the ED. CT Angiogram revealed a small incompletely obstructing pulmonary embolus in a branch of the superior segment left lower lobe pulmonary artery. Etiology of the PE  unknown. Patient without known risk factors for VTE: No known coagulopathies (no known personal or family hx of clotting or cancer), no recent venous stasis (no recent long-distance travel, flights, or immobilization), no known endothelial damage (no recent trauma or surgeries), and no signs/symptoms of a DVT. 6 months of anticoagulation recommended. He was observed on telemetry, and initially placed on a Heparin drip and Coumadin but was switched to Xarelto as pharmacy mentioned a program in which he could get at least a month free and possibly up to 6 months. - On Xarelto - Observed on Tele  Tachycardia - Likely due to PE. Provided IV hydration and continued to monitor. Resolved. - NS @ 125 cc/hr - Observe on Tele  Adverse reaction to medication - Patient's original complaint on ED visit was itching after using Bactrim for possible infection on the left wrist post-surgery. He received Benadryl, Pepcid, Prednisone, and Zofran in the ED. LFTs were normal to ensure no  systemic cause of itching. Continued Zofran and Benadryl while admitted.  - On Benadryl PRN (Itching) - On Zofran PRN (N/V) - Monitor  Dispo: Anticipated discharge is today with resolution of symptoms.  The patient does not have a current PCP (No Pcp Per Patient) and does need an [facility] hospital followup appointment after discharge.  The patient does not have transportation limitations that hinder transportation to clinic appointments.   This is a Psychologist, occupational Note.  The care of the patient was discussed with Dr. Sherrine Maples and the assessment and plan formulated with their assistance.  Please see their attached note for official documentation of the daily encounter.   LOS: 1 day   Ilean Skill, Med Student 04/17/2013, 8:52 AM

## 2013-04-17 NOTE — Progress Notes (Signed)
Internal Medicine Attending  Date: 04/17/2013  Patient name: Darin ArenaChristopher Ion Medical record number: [id number] Date of birth: 1985/07/09 Age: 28 y.o. Gender: male  I saw and evaluated the patient on A.M rounds, and discussed his care with housestaff.  I reviewed the resident's note by Dr. Sherrine MaplesGlenn and I agree with the resident's findings and plans as documented in her note.   Patient reports that he is feeling much better; he has no chest pain or shortness of breath, and his tachycardia has resolved.  His itching has essentially resolved.  We discussed plans for treatment of pulmonary embolism with patient, including the benefits and risks of his blood thinner and his need for close followup as outpatient.  We have advised that he follow up with orthopedic surgery regarding his prior wrist surgery; he says that he completed a two-week course of oral trimethoprim sulfamethoxazole, and there are no signs of infection at this time.

## 2013-04-17 NOTE — Progress Notes (Signed)
Assessment unchanged. Discussed D/C instructions with pt including f/u appointments and medications. Informed pt of need to take evening dose of Xarelto as soon as prescription is obtained. Pt verbalized understanding. RX given to pt. IV and tele removed. Pt left via foot accompanied by RN to bus stop with belongings.

## 2013-04-17 NOTE — Progress Notes (Signed)
Clinical Education officer, museum (CSW) received call from RN stating that patient needs a bus pass. Per MD patient is medically stable to ride the bus. CSW met with patient to give him a bus pass prior to D/C today. Patient thanked CSW and reported that he knew where the nearest bus stop was at. Please reconsult if further social work needs arise. CSW signing off.   Blima Rich, [place] Weekend CSW [phone]

## 2013-04-19 LAB — HIV 1/2 CONFIRMATION
HIV 1 ANTIBODY: POSITIVE — AB
HIV 2 AB: NEGATIVE

## 2013-04-19 NOTE — Care Management Note (Unsigned)
    Page 1 of 1   04/19/2013     2:32:26 PM   CARE MANAGEMENT NOTE 04/19/2013  Patient:  Darin Morris,Darin Morris   Account Number:  [id number]  Date Initiated:  04/19/2013  Documentation initiated by:  Treshawn Allen  Subjective/Objective Assessment:   PT ADM WITH PE ON 04/16/13.  PT HAS NO INSURANCE AND NO PCP.     Action/Plan:   PT TO DC ON XARELTO.  PT GIVEN 30 DAY FREE TRIAL CARD FOR XARELTO.  INITIATED PT ASSISTANCE FORMS FOR XARELTO.  FORMS IN PT'S CHART FOR MD TO COMPLETE/WEEKEND CM TO FOLLOW UP   Anticipated DC Date:  04/20/2013   Anticipated DC Plan:  HOME/SELF CARE      DC Planning Services  CM consult  Medication Assistance      Choice offered to / List presented to:             Status of service:  In process, will continue to follow Medicare Important Message given?   (If response is "NO", the following Medicare IM given date fields will be blank) Date Medicare IM given:   Date Additional Medicare IM given:    Discharge Disposition:    Per UR Regulation:  Reviewed for med. necessity/level of care/duration of stay  If discussed at Long Length of Stay Meetings, dates discussed:    Comments:

## 2013-04-21 NOTE — ED Provider Notes (Signed)
Medical screening examination/treatment/procedure(s) were performed by non-physician practitioner and as supervising physician I was immediately available for consultation/collaboration.   EKG Interpretation   Date/Time:  Friday April 16 2013 07:43:40 EDT Ventricular Rate:  110 PR Interval:  140 QRS Duration: 95 QT Interval:  339 QTC Calculation: 459 R Axis:   74 Text Interpretation:  Sinus tachycardia Borderline T wave abnormalities No  previous tracing Confirmed by Anitra LauthPLUNKETT  MD, WHITNEY ([id number]) on 04/16/2013  7:50:53 AM       Olivia Mackielga M Chriselda Leppert, MD 04/21/13 2152

## 2013-04-30 ENCOUNTER — Ambulatory Visit: Payer: Self-pay | Admitting: Internal Medicine

## 2013-05-04 ENCOUNTER — Encounter: Payer: Self-pay | Admitting: General Practice

## 2015-01-18 IMAGING — CT CT ANGIO CHEST
2 of 9 series · 18 of 46 positions shown · IV contrast (omnipaque)
Comparison: Chest radiograph April 16, 2013

CLINICAL DATA: Shortness of breath and chest pain

EXAM:
CT ANGIOGRAPHY CHEST WITH CONTRAST
TECHNIQUE: Multidetector CT imaging of the chest was performed using the
standard protocol during bolus administration of intravenous
contrast. Multiplanar CT image reconstructions and MIPs were
obtained to evaluate the vascular anatomy.
CONTRAST:  75mL OMNIPAQUE IOHEXOL 350 MG/ML SOLN

[Series 5: thins · axial · 0.61mm/px · z∈[+1325,+1539]mm · 15 of 244 slices shown]
[im 15/244  lung]
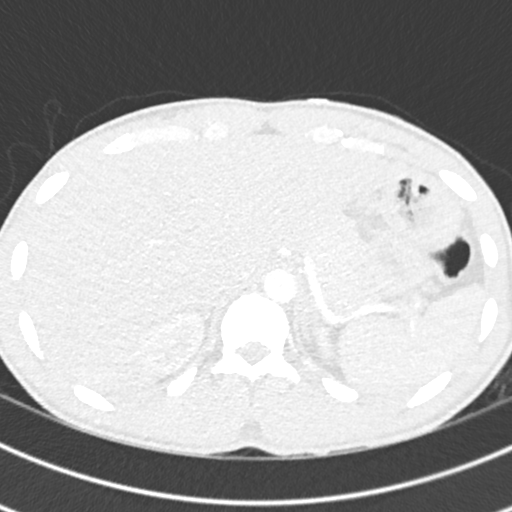
[im 29/244  soft-tissue]
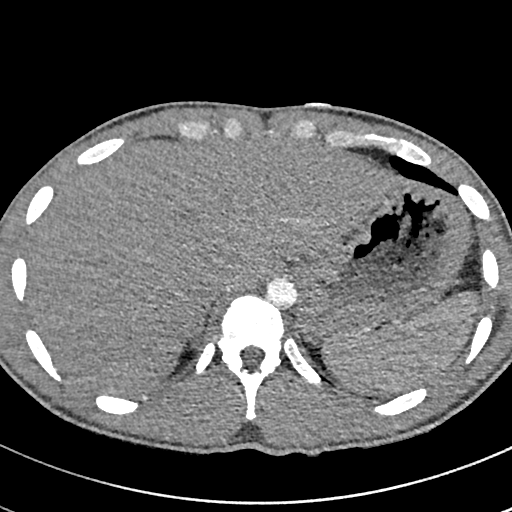
[im 43/244  lung]
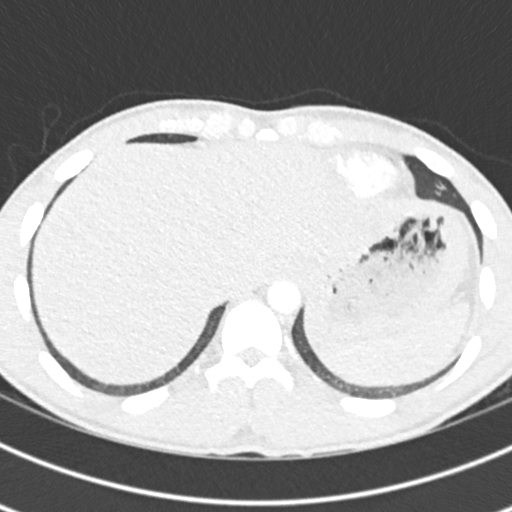
[im 58/244  soft-tissue]
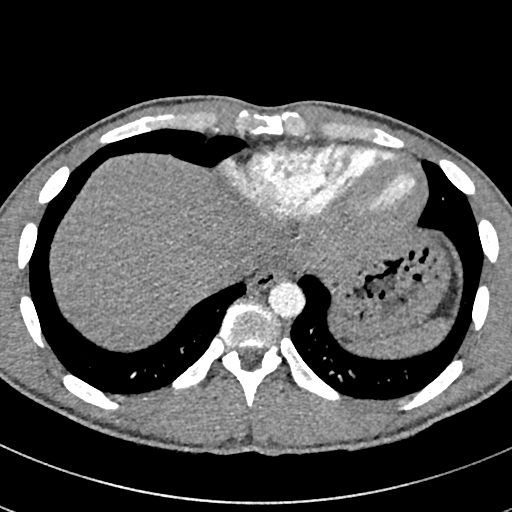
[im 72/244  lung]
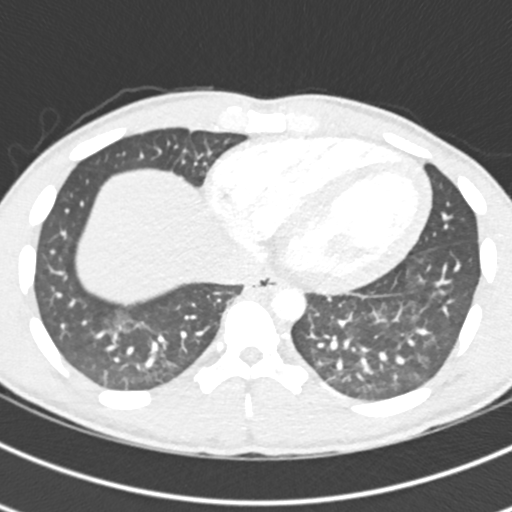
[im 86/244  soft-tissue]
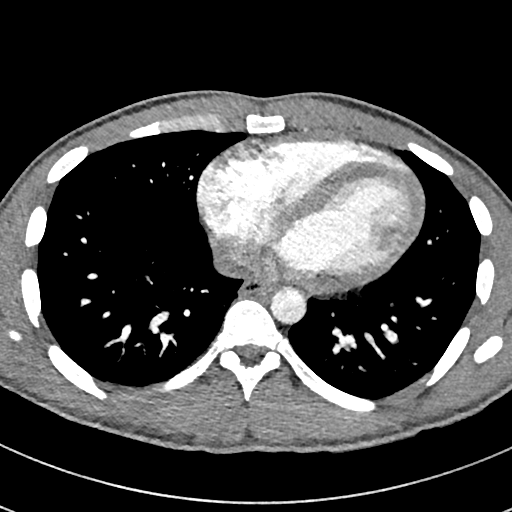
[im 101/244  lung]
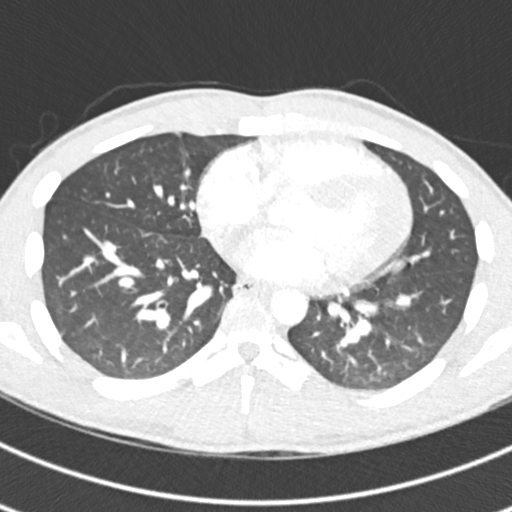
[im 129/244  soft-tissue]
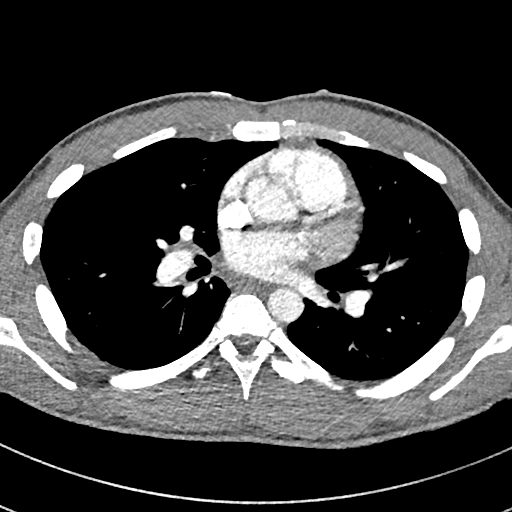
[im 143/244  lung]
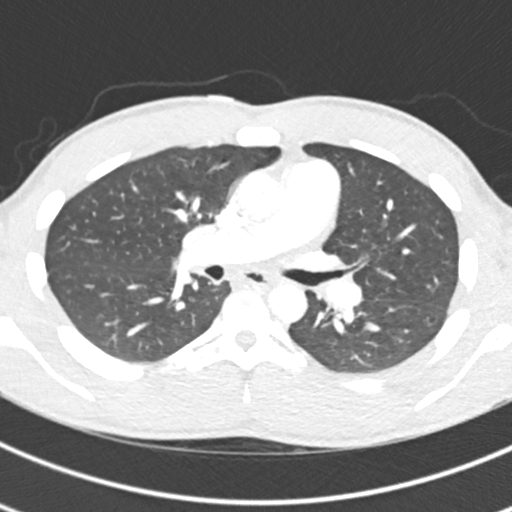
[im 158/244  soft-tissue]
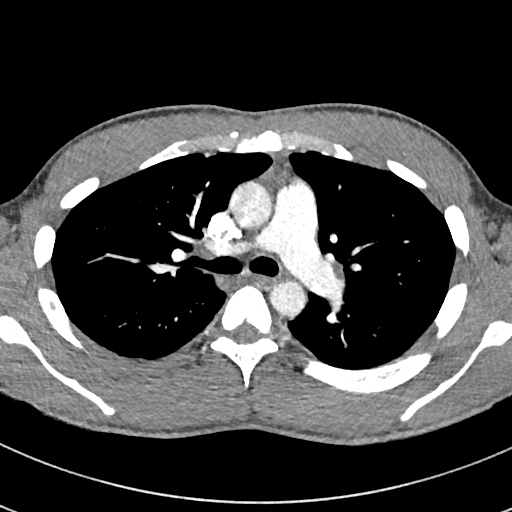
[im 172/244  lung]
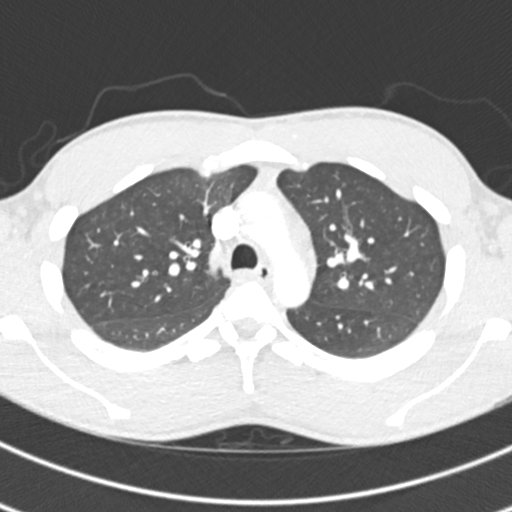
[im 186/244  soft-tissue]
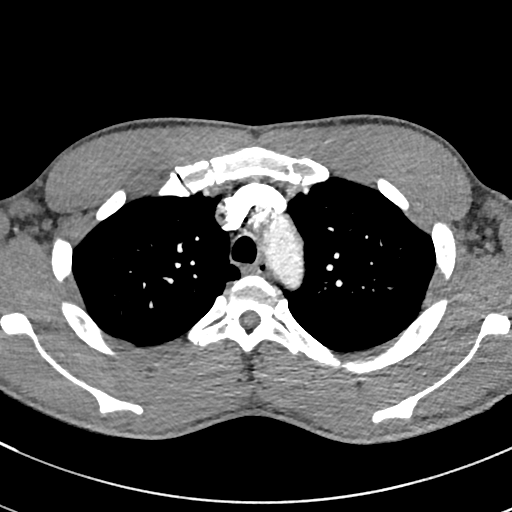
[im 201/244  lung]
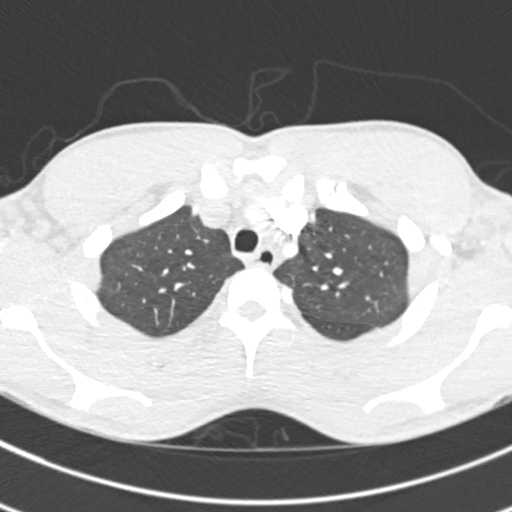
[im 215/244  soft-tissue]
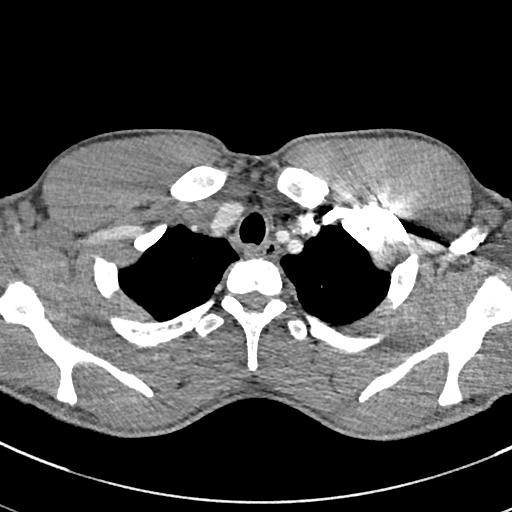
[im 229/244  lung]
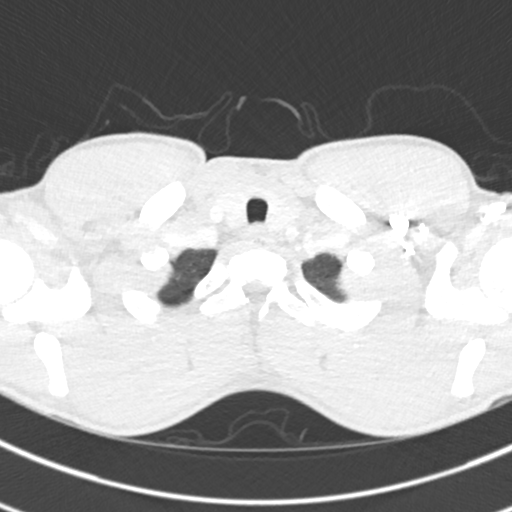

[Series 7: coronal mpr · coronal · 0.50mm/px · 3 of 101 slices shown]
[im 26/101  soft-tissue]
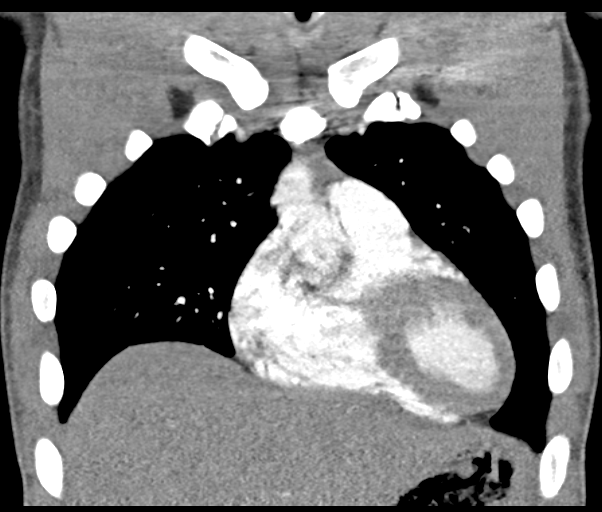
[im 51/101  soft-tissue]
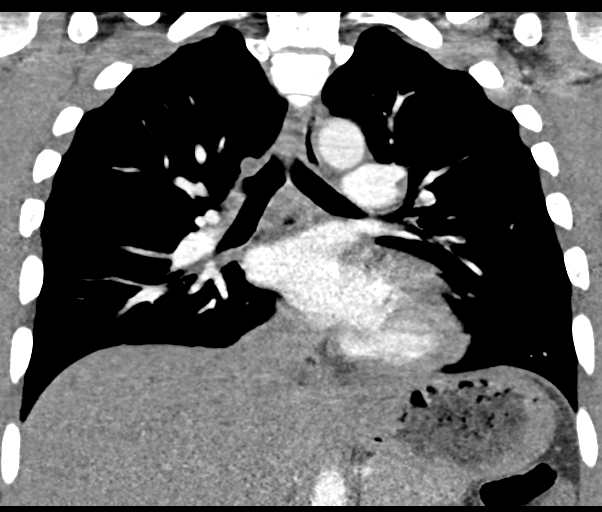
[im 76/101  soft-tissue]
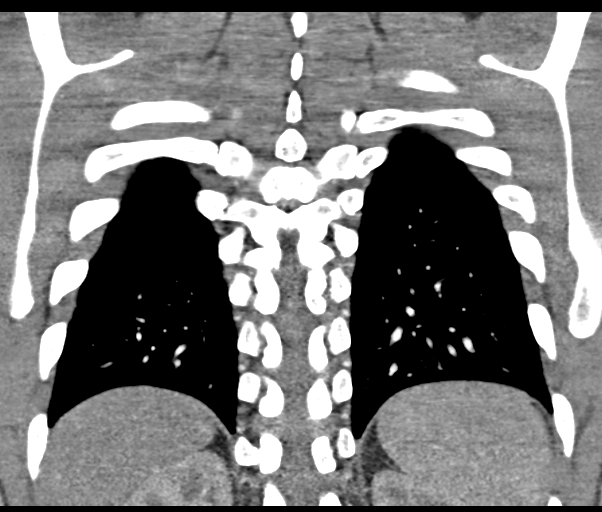

[18 of 46 positions shown; findings below may reference images not displayed]

FINDINGS: There is a small incompletely obstructing pulmonary embolus in a
superior segment left lower lobe pulmonary artery branch. This small
pulmonary embolus is seen on axial slice 49 series 4 and is
confirmed on coronal slice 73 series 7 and sagittal slice 106 series
9. No other pulmonary embolus seen. No larger vessel pulmonary
embolus in particular. No evidence of thoracic aortic aneurysm or
dissection.

The lungs are clear. There is no appreciable thoracic adenopathy.
Pericardium is not thickened.

Visualized upper abdominal structures appear normal. No blastic or
lytic bone lesions are identified.

Review of the MIP images confirms the above findings.
IMPRESSION: Small incompletely obstructing pulmonary embolus in a branch of the
superior segment left lower lobe pulmonary artery. No larger vessel
pulmonary embolus. Lungs clear.

Critical Value/emergent results were called by telephone at the time
of interpretation on 04/16/2013 at [DATE] to MANVIR GUINTO, PA, who
verbally acknowledged these results.

## 2021-11-23 ENCOUNTER — Emergency Department
Admission: EM | Admit: 2021-11-23 | Discharge: 2021-11-23 | Discharge status: Against Medical Advice | Payer: Medicaid Other | Attending: Emergency Medicine | Admitting: Emergency Medicine

## 2021-11-23 DIAGNOSIS — Z5321 Procedure and treatment not carried out due to patient leaving prior to being seen by health care provider: Secondary | ICD-10-CM | POA: Insufficient documentation

## 2021-11-23 MED ORDER — SODIUM CHLORIDE 0.9 % IV BOLUS
1000.0000 mL | Freq: Once | INTRAVENOUS | Status: DC
Start: 2021-11-23 — End: 2021-11-23

## 2021-11-23 NOTE — ED Notes (Signed)
Bed: H 4  Expected date:   Expected time:   Means of arrival:   Comments:  Medic 440

## 2021-11-24 NOTE — Progress Notes (Signed)
ER staff contacted CM to coordinate [employer] transportation. Lyft details provided to ER staff. CM will continue to follow as needed.

## 2023-07-03 ENCOUNTER — Emergency Department
Admission: EM | Admit: 2023-07-03 | Discharge: 2023-07-03 | Disposition: A | Discharge status: Home / Self Care | Attending: Emergency Medicine | Admitting: Emergency Medicine

## 2023-07-03 DIAGNOSIS — F2089 Other schizophrenia: Secondary | ICD-10-CM

## 2023-07-03 DIAGNOSIS — F10920 Alcohol use, unspecified with intoxication, uncomplicated: Secondary | ICD-10-CM | POA: Insufficient documentation

## 2023-07-03 DIAGNOSIS — T50901A Poisoning by unspecified drugs, medicaments and biological substances, accidental (unintentional), initial encounter: Secondary | ICD-10-CM

## 2023-07-03 DIAGNOSIS — F331 Major depressive disorder, recurrent, moderate: Secondary | ICD-10-CM

## 2023-07-03 DIAGNOSIS — Z91199 Patient's noncompliance with other medical treatment and regimen due to unspecified reason: Secondary | ICD-10-CM

## 2023-07-03 DIAGNOSIS — Y908 Blood alcohol level of 240 mg/100 ml or more: Secondary | ICD-10-CM | POA: Insufficient documentation

## 2023-07-03 DIAGNOSIS — F1020 Alcohol dependence, uncomplicated: Secondary | ICD-10-CM

## 2023-07-03 DIAGNOSIS — F17210 Nicotine dependence, cigarettes, uncomplicated: Secondary | ICD-10-CM | POA: Insufficient documentation

## 2023-07-03 DIAGNOSIS — Z008 Encounter for other general examination: Secondary | ICD-10-CM

## 2023-07-03 DIAGNOSIS — I10 Essential (primary) hypertension: Secondary | ICD-10-CM | POA: Insufficient documentation

## 2023-07-03 DIAGNOSIS — R45851 Suicidal ideations: Secondary | ICD-10-CM | POA: Insufficient documentation

## 2023-07-03 DIAGNOSIS — Z59819 Housing instability, housed unspecified: Secondary | ICD-10-CM

## 2023-07-03 DIAGNOSIS — Z560 Unemployment, unspecified: Secondary | ICD-10-CM | POA: Insufficient documentation

## 2023-07-03 LAB — ECG 12-LEAD
Atrial Rate: 87 {beats}/min
IHS MUSE NARRATIVE AND IMPRESSION: NORMAL
P Axis: 54 degrees
P-R Interval: 156 ms
Q-T Interval: 412 ms
QRS Duration: 112 ms
QTC Calculation (Bezet): 495 ms
R Axis: -21 degrees
T Axis: -7 degrees
Ventricular Rate: 87 {beats}/min

## 2023-07-03 LAB — URINALYSIS WITH REFLEX TO MICROSCOPIC EXAM - REFLEX TO CULTURE
Urine Bilirubin: NEGATIVE
Urine Blood: NEGATIVE
Urine Glucose: NEGATIVE
Urine Ketones: NEGATIVE mg/dL
Urine Leukocyte Esterase: NEGATIVE
Urine Nitrite: NEGATIVE
Urine Specific Gravity: 1.022 (ref 1.001–1.035)
Urine Urobilinogen: NORMAL mg/dL (ref 0.2–2.0)
Urine pH: 6 (ref 5.0–8.0)

## 2023-07-03 LAB — MAGNESIUM: Magnesium: 1.5 mg/dL — ABNORMAL LOW (ref 1.6–2.6)

## 2023-07-03 LAB — LAB USE ONLY - CBC WITH DIFFERENTIAL
Absolute Basophils: 0.03 x10 3/uL (ref 0.00–0.08)
Absolute Eosinophils: 0.1 x10 3/uL (ref 0.00–0.44)
Absolute Immature Granulocytes: 0.02 x10 3/uL (ref 0.00–0.07)
Absolute Lymphocytes: 1.48 x10 3/uL (ref 0.42–3.22)
Absolute Monocytes: 0.72 x10 3/uL (ref 0.21–0.85)
Absolute Neutrophils: 3.66 x10 3/uL (ref 1.10–6.33)
Absolute nRBC: 0 x10 3/uL (ref <=0.00)
Basophils %: 0.5 %
Eosinophils %: 1.7 %
Hematocrit: 40.9 % (ref 37.6–49.6)
Hemoglobin: 13.8 g/dL (ref 12.5–17.1)
Immature Granulocytes %: 0.3 %
Lymphocytes %: 24.6 %
MCH: 32.9 pg (ref 25.1–33.5)
MCHC: 33.7 g/dL (ref 31.5–35.8)
MCV: 97.4 fL — ABNORMAL HIGH (ref 78.0–96.0)
MPV: 10.1 fL (ref 8.9–12.5)
Monocytes %: 12 %
Neutrophils %: 60.9 %
Platelet Count: 187 x10 3/uL (ref 142–346)
Preliminary Absolute Neutrophil Count: 3.66 x10 3/uL (ref 1.10–6.33)
RBC: 4.2 x10 6/uL (ref 4.20–5.90)
RDW: 14 % (ref 11–15)
WBC: 6.01 x10 3/uL (ref 3.10–9.50)
nRBC %: 0 /100{WBCs} (ref <=0.0)

## 2023-07-03 LAB — COMPREHENSIVE METABOLIC PANEL
ALT: 89 U/L — ABNORMAL HIGH (ref <=55)
AST (SGOT): 132 U/L — ABNORMAL HIGH (ref <=41)
Albumin/Globulin Ratio: 0.7 — ABNORMAL LOW (ref 0.9–2.2)
Albumin: 3.4 g/dL — ABNORMAL LOW (ref 3.5–5.0)
Alkaline Phosphatase: 232 U/L — ABNORMAL HIGH (ref 37–117)
Anion Gap: 12 (ref 5.0–15.0)
BUN: 12 mg/dL (ref 9–28)
Bilirubin, Total: 0.5 mg/dL (ref 0.2–1.2)
CO2: 24 meq/L (ref 17–29)
Calcium: 8.3 mg/dL — ABNORMAL LOW (ref 8.5–10.5)
Chloride: 103 meq/L (ref 99–111)
Creatinine: 1 mg/dL (ref 0.5–1.5)
GFR: >60 mL/min/1.73 m2 (ref >=60.0)
Globulin: 5.1 g/dL — ABNORMAL HIGH (ref 2.0–3.6)
Glucose: 77 mg/dL (ref 70–100)
Potassium: 3 meq/L — ABNORMAL LOW (ref 3.5–5.3)
Protein, Total: 8.5 g/dL — ABNORMAL HIGH (ref 6.0–8.3)
Sodium: 139 meq/L (ref 135–145)

## 2023-07-03 LAB — ACETAMINOPHEN LEVEL: Acetaminophen Level: <7 ug/mL — ABNORMAL LOW (ref 10–30)

## 2023-07-03 LAB — SALICYLATE LEVEL: Salicylate: <5 mg/dL — ABNORMAL LOW (ref 15.0–30.0)

## 2023-07-03 LAB — URINE DRUGS OF ABUSE SCREEN
Urine Amphetamine Screen: NEGATIVE
Urine Barbituate Screen: NEGATIVE
Urine Benzodiazepine Screen: NEGATIVE
Urine Cannabinoid Screen: POSITIVE — AB
Urine Cocaine Screen: NEGATIVE
Urine Fentanyl Screen: NEGATIVE
Urine Opiate Screen: NEGATIVE
Urine PCP Screen: NEGATIVE

## 2023-07-03 LAB — SARS-COV-2 (COVID-19) RNA, PCR (LIAT): SARS-CoV-2 (COVID-19) RNA: NOT DETECTED

## 2023-07-03 LAB — LAB USE ONLY - URINE GRAY CULTURE HOLD TUBE

## 2023-07-03 LAB — ETHANOL (ALCOHOL) LEVEL: Alcohol: 332 mg/dL — ABNORMAL HIGH

## 2023-07-03 MED ORDER — POTASSIUM CHLORIDE 20 MEQ PO PACK
40.0000 meq | PACK | Freq: Once | ORAL | Status: AC
Start: 2023-07-03 — End: 2023-07-03
  Administered 2023-07-03: 40 meq via ORAL
  Filled 2023-07-03: qty 2

## 2023-07-03 MED ORDER — OLANZAPINE 5 MG PO TBDP
5.0000 mg | ORAL_TABLET | Freq: Once | ORAL | Status: AC
Start: 2023-07-03 — End: 2023-07-03
  Administered 2023-07-03: 5 mg via ORAL
  Filled 2023-07-03: qty 1

## 2023-07-03 NOTE — Consults (Signed)
 EMERGENCY PSYCHIATRY: INITIAL PSYCHIATRIC ASSESSMENT     Patient name:  [name], [name]  Date of birth:  1985-11-30  Age:  38 y.o.  MRN:  [id number]  CSN:  [id number]  Patient Location: [facility] EMERGENCY DEPT   Date of consultation:  07/03/2023  Attending/Referring Physician:  Fremont Lynwood CHRISTELLA Mickey., MD  Consulting Advanced Practice Provider: Earlean PARAS Ami Thornsberry, NP   Reason for consultation: Recommendations regarding management and/or disposition of a behavioral health patient currently in the emergency department.     Assessment:  Patient is a homeless male with significant psychosocial stressors, chronic psychiatric illness (schizophrenia), and substance use concerns. He presented with depressive symptoms, passive suicidal ideation without intent or plan, and a desire for alcohol detox, which he ultimately declined in favor of attending a probation meeting. He demonstrates limited insight, poor coping mechanisms, and nonadherence to psychiatric medications. However, he maintains outpatient supports, has identifiable protective factors (relationships with others), and currently denies active SI/HI or psychosis.    -Patient deemed safe for discharge with outpatient follow-up.  - Provided with detox and psychiatric resource information.  - Reinforced the importance of medication adherence and maintaining outpatient care.  - Encouraged to fill prescriptions and engage support systems.  - No acute psychiatric admission indicated at this time due to stable presentation and absence of active SI/HI/psychosis.    DSM-5 Diagnosis:   F20.9 Schizophrenia  F10.20 Alcohol abuse disorder, moderate to severe  F33.1 MDD, recurrent, moderate  Z59.9 Unspecified housing instability  Z91.19 Nonadherence to medical treatment    Medical Diagnoses: Overweight: BMI of 25 to 29.9 based on BMI criteria     Psycho-Social and Environmental factors: Homelessness, unemployment, bereavement, on probation, history of substance use,  limited support, nonadherence to psychiatric medications, history of psychiatric hospitalizations.    Is this patient being admitted No    Suicide Risk Assessment:  []  Unable to assess due to psychiatric condition  Demographic Risk Factors: Male, Single, and Not engaged in work or school  Static Risk Factors: History of substance abuse  Dynamic Risk Factors: Depressive symptoms, Hopelessness, Recent non-adherence to treatment, and Recent substance use  Protective Factors: Social support, Mental health support, Stable mood, and Denies suicidal ideation  Clinical Level of Suicide Risk - Chronic: low  Clinical Level of Suicide Risk - Imminent: low  Monitoring: Outpatient follow up  Safety plan completed on discharge: The patient denies any thoughts or plans to harm or kill himself and a safety plan is not indicated     Recommendations:   Disposition: The patient denies any thoughts of plans of harming themself or others and is able to keep themself safe and meet their basic needs.  As a result, admission to an acute psychiatric unit is not currently warranted. While it is impossible to predict future events with certainty, adherence to recommended treatments, which may encompass medication, counseling, and/or abstaining from substance use, will assist in reducing the risk to themselves or others. This assessment has been discussed with the patient and emergency contact numbers have been provided.   N/A  If the patient decides to leave prior to admission: N/A    Medication:Zyprexa 15 mg, Naltrexone 50 mg, Prazosin 1 mg, Sertraline 50 mg    Follow-up: The patient is currently receiving outpatient services and has been advised to call and schedule a follow-up within 2 days    If the patient is admitted voluntarily then decides to leave AMA in the first 24 hours, has a petitioner been identified?  N/A    Was this patient presented for admission to [facility]? No, due to NA  If presented, were they accepted? NA    Informants:  patient    Chief Complaint:  I was only trying to [place], panics, and felt like he was going to hurt myself.    HPI:  [name] is a  38 y.o. disheveled-appearing male with a medical history of HIV, hypertension, and Elspeth Louder syndrome, and a psychiatric history of schizophrenia, who was brought to the ED by a police officer after experiencing acute psychological distress.  During the assessment, the patient presented with poor hygiene and grooming but remained calm, cooperative, and appropriately responsive. He did not appear to be responding to internal stimuli, though he looked sad and depressed.    The patient reported emotional overwhelmed, stating,  there is so much on my plate.  He described the precipitating event: While en route to [place] , [place] by train, he became confused and panicked when he could not figure out how to return to [place] , leading to escalating distress and thoughts of self-harm. Although he currently denies active suicidal ideation, he acknowledged intermittent thoughts such as  feeling really hopeless and thoughts of walking into traffic.    The patient is homeless, unemployed, and recently bereaved following his brother's death last year, which he identifies as a major emotional stressor. He is currently on probation for distribution charges and has a history of inpatient psychiatric hospitalization in 2023 admission under an emergency petition for attempting to walk into traffic.    He reports auditory hallucinations in the past, but denies current hallucinations or delusions. He also denies homicidal ideation. He drinks vodka every other day, though quantity is unclear, and he has a history of detox and rehabilitation. He complains of poor sleep and appetite.    Initially seeking alcohol detox, the patient changed his mind multiple times during the evaluation, citing a probation appointment the next day. Ultimately, he declined inpatient detox in favor of  outpatient follow-up, stating that he feels safe and not a danger to himself.    He reports an existing outpatient psychiatrist and therapist and noted seeing his psychiatrist last week. He was prescribed medications but has not filled them, stating the prescriptions are at his girlfriend's house. He last took his medications about a month ago. He was educated on the importance of medication adherence.    Psychiatric Review of Systems   []  Unable to assess due to psychiatric condition  Safety:  Access for firearm: no  Depressed mood OR anhedonia: Change in appetite, Insomnia  , and Diminished ability to think/concentrate  Anxiety: Worry, Anxiety, and Sleep disturbance  Hypomania/mania: Denies  Psychosis: Denies   Disordered eating: Denies   Trauma: History of trauma however denies any symptoms consistent with PTSD     Past Psychiatric History:   []  Unable to assess due to psychiatric condition  Previous psychiatric hospitalizations: Yes    Treatment and medication compliance:  No  Previous suicide attempts: Denies  History of violence/aggression: Denies  Currently in treatment:  No    Current Medications:  []  Unable to verify medications due to psychiatric condition  [x]  No current medications  []  Med list reviewed and updated  Home Medications       Med List Status: In Progress Set By: Queen Birmingham, RN at 07/03/2023  2:19 AM   No Medications        Substance Use History:  []  Unable to assess due  to psychiatric condition  The patient reports heavy alcohol use and marijuana more than 12 months ago  The patient reports heavy alcohol use and marijuana in the past 12 months.  The patient has a history of AUD/SUD treatment: Yes:    The patient had a history of delirium tremens:  No  The patient has a history of withdrawal seizures: No  Blood alcohol level: 332 @ 02:25 hours  Urine drug screen: Cannabinoids    Social History:   []  Unable to assess due to psychiatric condition  Marital status: single  Children in the  home: No  Currently employed: currently unemployed  Social support system: limitd/inadequate support  Living situation: Unhoused/unsheltered   Armed forces operational officer History: yes, (on probation for distribution)    Solicitor Information (family, surrogate, DPOA, caretaker, healthcare providers):   No emergency contact information on file.    Past Medical History:  Medical History[1]    Past Surgical History:  Past Surgical History[2]    Allergies:  Allergies[3]    Family History:  Family History[4]     Review of Systems  Psychiatric: Depression and Anxiety  Constitutional: No complaints    Current Evaluation:   Visit Vitals  BP 143/73   Pulse 69   Temp 98.1 F (36.7 C) (Oral)   Resp 20   Ht 1.727 m (5' 8)   Wt 81.6 kg (180 lb)   SpO2 99%   BMI 27.37 kg/m      Mental Status Exam  General appearance: Disheveled, poor hygiene, and poor grooming   Attitude/Behavior: Calm, cooperative and engaged  and Cooperative  Motor: No tics, PMA/PMR of other involuntary movements noted   Gait: Patient walks with a limp. (Reports having hx. of fracture to his right leg)  Muscle strength and tone: Symmetric, firm, coordinated  Speech: Spontaneous with normal rate, rhythm, volume, and intonation  Mood:  depressed   Affect: Full range  and Congruent with stated mood  Thought Process: Logical, coherent and goal directed   Tangential  Thought Content: No delusions elicited, denies suicidal ideation, homicidal ideation and violent thoughts  Perceptions: Normal, denies auditory, tactile or visual hallucinations, dissociation  Insight: Limited  Judgment: Impaired  Cognition: Alert, Oriented to time, place, person and situation , and Attention grossly intact: attentive through interview, able to concentrate on task     Current Laboratories/Other Tests:  Labs:   Results       Procedure Component Value Units Date/Time    Urine Drugs of Abuse Screen [[id number]]  (Abnormal) Collected: 07/03/23 1118    Specimen: Urine, Clean Catch Updated: 07/03/23 1149      Urine Amphetamine Screen Negative     Urine Barbituate Screen Negative     Urine Benzodiazepine Screen Negative     Urine Cannabinoid Screen Positive     Urine Cocaine Screen Negative     Urine Fentanyl Screen Negative     Urine Opiate Screen Negative     Urine PCP Screen Negative    Narrative:      Testing performed on [employer] instrumentation.    Samples with a reaction greater than or equal to the following  cutoff levels yield a presumptively Positive result:    Amphetamines           1000 ng/mL  Barbiturates            200 ng/mL  Benzodiazepines         200 ng/mL  Cannabinoids (THC)       50 ng/mL  Cocaine                 300 ng/ml  Fentanyl                1.0 ng/mL     Opiates                 300 ng/mL  PCP                      25 ng/mL    Drug Screens are unconfirmed and are for medical use only.   Call Lab within 1 week if GCMS confirmation is required.  If drug screen is negative and drug/medication use is still   suspected, order a blood and urine toxicology screen.     The Opiates screen is primarily for detection of morphine,   heroin and codeine. Consumption of poppy seeds may produce a   positive opiates screening result. The THC screen does not   detect synthetic cannabinoid-like compounds.    Urinalysis with Reflex to Microscopic Exam and Culture [[id number]]  (Abnormal) Collected: 07/03/23 1118    Specimen: Urine, Clean Catch Updated: 07/03/23 1140     Urine Color Yellow     Urine Clarity Clear     Urine Specific Gravity 1.022     Urine pH 6.0     Urine Leukocyte Esterase Negative     Urine Nitrite Negative     Urine Protein 30= 1+     Urine Glucose Negative     Urine Ketones Negative mg/dL      Urine Urobilinogen Normal mg/dL      Urine Bilirubin Negative     Urine Blood Negative     RBC, UA 0-2 /hpf      Urine WBC 0-5 /hpf      Urine Squamous Epithelial Cells 0-5 /hpf     Urine [employer] Tube [[id number]] Collected: 07/03/23 1118    Specimen: Urine, Clean Catch Updated: 07/03/23 1125     Magnesium [[id number]]  (Abnormal) Collected: 07/03/23 0933    Specimen: Blood, Venous Updated: 07/03/23 1000     Magnesium 1.5 mg/dL     RNCPI-80 (SARS-CoV-2) only Clint) [[id number]]  (Normal) Collected: 07/03/23 0233    Specimen: Swab from Anterior Nares Updated: 07/03/23 0318     SARS-CoV-2 (COVID-19) RNA Not Detected    Comprehensive Metabolic Panel [[id number]]  (Abnormal) Collected: 07/03/23 0225    Specimen: Blood, Venous Updated: 07/03/23 0313     Glucose 77 mg/dL      BUN 12 mg/dL      Creatinine 1.0 mg/dL      Sodium 860 mEq/L      Potassium 3.0 mEq/L      Chloride 103 mEq/L      CO2 24 mEq/L      Calcium 8.3 mg/dL      Anion Gap 87.9     GFR >60.0 mL/min/1.73 m2      AST (SGOT) 132 U/L      ALT 89 U/L      Alkaline Phosphatase 232 U/L      Albumin 3.4 g/dL      Protein, Total 8.5 g/dL      Globulin 5.1 g/dL      Albumin/Globulin Ratio 0.7     Bilirubin, Total 0.5 mg/dL     Ethanol (Alcohol) Level [[id number]]  (Abnormal) Collected: 07/03/23 0225    Specimen: Blood, Venous Updated: 07/03/23 0313     Alcohol 332 mg/dL  Acetaminophen Level [[id number]]  (Abnormal) Collected: 07/03/23 0225    Specimen: Blood, Venous Updated: 07/03/23 0313     Acetaminophen Level <7 ug/mL     Salicylate Level [[id number]]  (Abnormal) Collected: 07/03/23 0225    Specimen: Blood, Venous Updated: 07/03/23 0313     Salicylate <5.0 mg/dL     CBC with Differential (Order) [[id number]]  (Abnormal) Collected: 07/03/23 0225    Specimen: Blood, Venous Updated: 07/03/23 0258    Narrative:      The following orders were created for panel order CBC with Differential (Order).  Procedure                               Abnormality         Status                     ---------                               -----------         ------                     CBC with Differential (C.SABRASABRA[[id number]]  Abnormal            Final result                 Please view results for these tests on the individual orders.    CBC with Differential (Component) [[id number]]   (Abnormal) Collected: 07/03/23 0225    Specimen: Blood, Venous Updated: 07/03/23 0258     WBC 6.01 x10 3/uL      Hemoglobin 13.8 g/dL      Hematocrit 59.0 %      Platelet Count 187 x10 3/uL      MPV 10.1 fL      RBC 4.20 x10 6/uL      MCV 97.4 fL      MCH 32.9 pg      MCHC 33.7 g/dL      RDW 14 %      nRBC % 0.0 /100 WBC      Absolute nRBC 0.00 x10 3/uL      Preliminary Absolute Neutrophil Count 3.66 x10 3/uL      Neutrophils % 60.9 %      Lymphocytes % 24.6 %      Monocytes % 12.0 %      Eosinophils % 1.7 %      Basophils % 0.5 %      Immature Granulocytes % 0.3 %      Absolute Neutrophils 3.66 x10 3/uL      Absolute Lymphocytes 1.48 x10 3/uL      Absolute Monocytes 0.72 x10 3/uL      Absolute Eosinophils 0.10 x10 3/uL      Absolute Basophils 0.03 x10 3/uL      Absolute Immature Granulocytes 0.02 x10 3/uL            EKG:   EKG Results       Procedure Component Value Units Date/Time    ECG 12 lead [[id number]] Collected: 07/03/23 0250     Updated: 07/03/23 0649     Ventricular Rate 87 BPM      Atrial Rate 87 BPM      P-R Interval 156 ms      QRS Duration 112 ms  Q-T Interval 412 ms      QTC Calculation (Bezet) 495 ms      P Axis 54 degrees      R Axis -21 degrees      T Axis -7 degrees      IHS MUSE NARRATIVE AND IMPRESSION --     NORMAL SINUS RHYTHM  MINIMAL VOLTAGE CRITERIA FOR LVH, MAY BE NORMAL VARIANT ( Cornell product )  QTcB >= 480 msec  ABNORMAL ECG  NO PREVIOUS ECGS AVAILABLE  Confirmed by Glennette Levels [phone]) on 07/03/2023 6:49:41 AM      Narrative:      NORMAL SINUS RHYTHM  MINIMAL VOLTAGE CRITERIA FOR LVH, MAY BE NORMAL VARIANT ( Cornell product )  QTcB >= 480 msec  ABNORMAL ECG  NO PREVIOUS ECGS AVAILABLE  Confirmed by Glennette Levels [phone]) on 07/03/2023 6:49:41 AM           Imaging: No results found.       Thank you for allowing us  to participate in the care of this patient.   These findings and recommendations were discussed with the ED team caring for this patient and the note will be forwarded to  an attending psychiatrist for review.      I personally saw and examined the patient and spent 60 minutes performing the following as indicated: reviewing the record, obtaining collateral information, performing a medically appropriate evaluation, educating the patient, communicating with other health professionals and documenting clinical information.     Signed: July 03, 2023   Earlean PARAS Ameirah Khatoon, NP  [facility]         [1]   Past Medical History:  Diagnosis Date    HIV (human immunodeficiency virus infection) (CMS/HCC)     Hypertension     Stevens-Johnson syndrome     Tibia fracture    [2] History reviewed. No pertinent surgical history.  [3]   Allergies  Allergen Reactions    Bactrim [Sulfamethoxazole-Trimethoprim]     Ciprofloxacin     Vancomycin    [4] No family history on file.

## 2023-07-03 NOTE — ED Notes (Signed)
 Patient observed covered by blanket, respiration even and unlabored. Called several times for psychiatric evaluation with no success. Patient to be assessed when awake

## 2023-07-03 NOTE — ED Notes (Signed)
 TW attempted to assess Pt via telehealth. Pt appeared in his hospital bed moving under his blanket. Pt did not respond to TW. Pt then went to sleep and Pt's RN attempted to wake up Pt. Pt refused to wake up and participate in assessment. Pt will be added back to the que. ED team made aware and will reach out when Pt is ready and alert.

## 2023-07-03 NOTE — ED Notes (Signed)
 Patient states that he would like detox from [facility], that he drinks a few shots most days.  No history of seizures/Dts. Patient stated that he thinks he shoiuld go see his PO today since he missed appt with him this morning and will then maybe go to detox

## 2023-07-03 NOTE — ED Notes (Signed)
 TW spoke to Portia from [facility], pt was NEVER under an ECO. Pt was [employer]. Form was filled out by mistake.

## 2023-07-03 NOTE — ED Provider Notes (Addendum)
 [name]       Visit date: 07/03/2023       CLINICAL SUMMARY      Diagnosis:      Final diagnoses:   Suicidal ideation   Alcoholic intoxication without complication         MDM    Assessment & Plan  Suicidal ideation  Expressed emotional distress and thoughts of self-harm without current suicidal intent.  - Offer food and hydration as needed.      38 year old male here today because of stated depression and suicidal ideation brought in by police but not under E CO.  Clinically intoxicated on my initial exam but otherwise reassuring exam with no focal deficit.  Was somewhat hypertensive at that time.  Has a history of schizophrenia so did give a dose of Zyprexa just to help see if that would calm him down.  After this his blood pressure did improved and he felt much more comfortable.  Still endorses wanting to talk with a behavioral health therapist and so have requested consult from them.  Discussed the case with them but at this time he is a little bit too sleepy to interact with them because of the Zyprexa.  He does wake up to voice and has no focal neurologic deficit.   therefore will sign out to oncoming physician who will follow-up results of the behavioral health discussion and disposition accordingly after this.  In the meantime workup so far has been mostly unrevealing.  His alcohol was 332.  He has AST ALT elevation consistent with alcohol abuse.  Mild hypokalemia which we will replete here.    Medical Decision Making  Amount and/or Complexity of Data Reviewed  Labs: ordered.  ECG/medicine tests: ordered.    Risk  Prescription drug management.             Disposition:         Patient signed out in bedside rounds to Dr. Fremont at 6:02 AM.      Final ED disposition (if available):      ED Disposition       ED Disposition   Discharge    Condition   --    Date/Time   Thu Jul 03, 2023  1:03 PM    Comment   [name]  discharge to home/self care.    Condition at disposition: Stable                      CLINICAL INFORMATION       HPI:  Chief Complaint: Psychiatric Evaluation  .    [name] is a 38 y.o. male who presents with SI.     History of Present Illness  [name] is a 38 year old male who presents with concerns about his mental health.    He is experiencing significant emotional distress following the loss of his brother, feeling 'pretty down' with thoughts of self-harm but denies current suicidal ideation. He notes a change in his demeanor, expressing confusion about his feelings.       History obtained from: patient, Police    ROS:    Positive and negative ROS elements as per HPI.  All other systems reviewed and negative.    Physical Exam:    Pulse 94  BP (!) 176/117  Resp 20  SpO2 98 %  Temp 97 F (36.1 C)      Physical Exam  CONSTITUTIONAL: Oriented to person, place,  and time. Appears well-developed and well-nourished. No distress.  HEAD: Normocephalic Atrauamtic  EYES: EOMI. Right eye exhibits no discharge. Left eye exhibits no discharge.  NECK: Normal Range of Motion, Neck supple, no tracheal deviation  CV: Regular rate and rhythm, no murmurs rubs or gallops.  PULM: Clear to auscultation bilaterally. Talking in full sentences. No respiratory distress.  ABDOMEN: Soft, nontender, nondistended. No rebound or guarding.  MSK: No obvious deformities in all four extremities. Moves all extremities spontaneously. No edema.  NEURO: Alert and oriented to person, place and time. No obvious focal neurologic deficit.  Moves all extremities spontaneously.  Answers all questions appropriately.  Pupils equal round reactive to light.  No facial droop.  SKIN: Skin is warm and dry. No rash. Not diaphoretic. No erythema. No pallor.  PSYCH: Clinically intoxicated.       PAST HISTORY   Primary Care Provider: Pcp, None, MD      PMH/PSH:       Medical History[1]    He has no past surgical history on  file.      Social/Family History:  He reports that he has been smoking cigarettes. He has never used smokeless tobacco. He reports current alcohol use. He reports that he does not currently use drugs.    Family History[2]    Listed Medications on Arrival:  Home Medications       Med List Status: In Progress Set By: Queen Birmingham, RN at 07/03/2023  2:19 AM   No Medications        Allergies: He is allergic to bactrim [sulfamethoxazole-trimethoprim], ciprofloxacin, and vancomycin.     VISIT INFORMATION   Clinical Course in the ED:  ED Course as of 07/04/23 0012   Thu Jul 03, 2023   0605 Voluntary.  Medically cleared.  Awaiting sobriety and eval. [JC]   1303 On assessment once sober, denies SI/HI.  Requests to be discharged to meet with probation officer.  Patient being discharged in stable condition. [JC]      ED Course User Index  [JC] Cogbill, Lynwood CHRISTELLA Raddle., MD         Medications Given in the ED:  ED Medication Orders (From admission, onward)      Start Ordered     Status Ordering Provider    07/03/23 0603 07/03/23 0602  potassium chloride (KLOR-CON) packet 40 mEq  Once        Route: Oral  Ordered Dose: 40 mEq       Last MAR action: Given Ashtian Villacis C    07/03/23 0312 07/03/23 0311  OLANZapine zydis (ZyPREXA ZYDIS) disintegrating tablet 5 mg  Once        Route: Oral  Ordered Dose: 5 mg       Last MAR action: Given Marv Alfrey C              Procedures:  Procedures      Interpretations:  O2 Sat:  The patient's oxygen saturation was 98 % on room air. This was independently interpreted by me as Normal.   EKG: I reviewed and Independently interpreted the patient's EKG as sinus rate 87 normal axis no ectopy LVH                 RESULTS   Lab Results:  Results for orders placed or performed during the hospital encounter of 07/03/23 (from the past 24 hours)   Comprehensive Metabolic Panel    Collection Time: 07/03/23  2:25 AM   Result Value  Glucose 77    BUN 12    Creatinine 1.0    Sodium 139    Potassium  3.0 (L)    Chloride 103    CO2 24    Calcium 8.3 (L)    Anion Gap 12.0    GFR >60.0    AST (SGOT) 132 (H)    ALT 89 (H)    Alkaline Phosphatase 232 (H)    Albumin 3.4 (L)    Protein, Total 8.5 (H)    Globulin 5.1 (H)    Albumin/Globulin Ratio 0.7 (L)    Bilirubin, Total 0.5   Ethanol (Alcohol) Level    Collection Time: 07/03/23  2:25 AM   Result Value    Alcohol 332 (H)   Acetaminophen Level    Collection Time: 07/03/23  2:25 AM   Result Value    Acetaminophen Level <7 (L)    Collection Time: 07/03/23  2:25 AM   Result Value    Salicylate <5.0 (L)   CBC with Differential (Component)    Collection Time: 07/03/23  2:25 AM   Result Value    WBC 6.01    Hemoglobin 13.8    Hematocrit 40.9    Platelet Count 187    MPV 10.1    RBC 4.20    MCV 97.4 (H)    MCH 32.9    MCHC 33.7    RDW 14    nRBC % 0.0    Absolute nRBC 0.00    Preliminary Absolute Neutrophil Count 3.66    Neutrophils % 60.9    Lymphocytes % 24.6    Monocytes % 12.0    Eosinophils % 1.7    Basophils % 0.5    Immature Granulocytes % 0.3    Absolute Neutrophils 3.66    Absolute Lymphocytes 1.48    Absolute Monocytes 0.72    Absolute Eosinophils 0.10    Absolute Basophils 0.03    Absolute Immature Granulocytes 0.02   COVID-19 (SARS-CoV-2) only ([place])    Collection Time: 07/03/23  2:33 AM    Specimen: Anterior Nares; Swab   Result Value    SARS-CoV-2 (COVID-19) RNA Not Detected    Collection Time: 07/03/23  9:33 AM   Result Value    Magnesium 1.5 (L)   Urine Drugs of Abuse Screen    Collection Time: 07/03/23 11:18 AM   Result Value    Urine Amphetamine Screen Negative    Urine Barbituate Screen Negative    Urine Benzodiazepine Screen Negative    Urine Cannabinoid Screen Positive (A)    Urine Cocaine Screen Negative    Urine Fentanyl Screen Negative    Urine Opiate Screen Negative    Urine PCP Screen Negative   Urinalysis with Reflex to Microscopic Exam and Culture    Collection Time: 07/03/23 11:18 AM    Specimen: Urine, Clean Catch   Result Value    Urine Color  Yellow    Urine Clarity Clear    Urine Specific Gravity 1.022    Urine pH 6.0    Urine Leukocyte Esterase Negative    Urine Nitrite Negative    Urine Protein 30= 1+ (A)    Urine Glucose Negative    Urine Ketones Negative    Urine Urobilinogen Normal    Urine Bilirubin Negative    Urine Blood Negative    RBC, UA 0-2    Urine WBC 0-5    Urine Squamous Epithelial Cells 0-5   Urine Elnor Culture Hold Tube  Collection Time: 07/03/23 11:18 AM   Result Value    Extra Tube Hold for add-ons.         Radiology Results:  No orders to display         Scribe Attestation:  Scribe Attestation: There was no scribe involved in the care of this patient.                  Jeneane Azalia BROCKS, MD  07/03/23 0606       [1]   Past Medical History:  Diagnosis Date    HIV (human immunodeficiency virus infection) (CMS/HCC)     Hypertension     Stevens-Johnson syndrome     Tibia fracture    [2] No family history on file.       Jeneane Azalia BROCKS, MD  07/04/23 [phone]

## 2023-07-03 NOTE — Progress Notes (Signed)
 SW received call from ED RN [facility] requesting assistance w/ transport.     ED RN confirmed pt is ambulatory and oriented. ED RN confirmed pt does not have a phone and drop off address. SW arranged cab. ED RN to inform pt that cab is a courtesy as SW not able to consistently get cabs for that far.      Case Management location: onsite    Systems Case Management Progress Note:   Type of Services Provider Name   Provider Phone Number   Length of Need SCM approved by:  Comments:   Skilled Nursing Facility "SNF"        Assisted Living        LTAC        Dialysis        Home Health        Infusion        DME        Medications        Transportation [employer] [phone] 1x [name].  Pick up: [address], ED Entrance  Drop off: [address] [facility] [address] [name], [place] [address]   Non-skilled Care ie Private Duty Aide          Financial form completed: Yes   No   Financial assessment completed: Yes   No     Bryan Bring, LMSW, Supervisee in Social Work  ED Social Work Case Manager I  [employer]  T: [phone])

## 2023-07-03 NOTE — ED Notes (Signed)
 Tw spoke with Jeneba from [facility], who stated that they don't have this pt on their board

## 2023-07-03 NOTE — ED Notes (Signed)
 [facility] EMERGENCY DEPARTMENT  MENTAL HEALTH SUBSEQUENT CARE NOTE     Patient Name: DEMTRIUS, [name]  Initial Encounter Date:  07/03/2023  Today's Date:  07/03/23                                                             Clinical Course:      Patient received in sign out from Dr. Jeneane Azalia BROCKS, MD approximately 6:05 AM    Clinical Course:    ED Course as of 07/03/23 1304   Thu Jul 03, 2023   0605 Voluntary.  Medically cleared.  Awaiting sobriety and eval. [JC]   1303 On assessment once sober, denies SI/HI.  Requests to be discharged to meet with probation officer.  Patient being discharged in stable condition. [JC]      ED Course User Index  [JC] Glade Strausser, Lynwood CHRISTELLA Raddle., MD       Encounter Medications:  Medications   OLANZapine zydis (ZyPREXA ZYDIS) disintegrating tablet 5 mg (5 mg Oral Given 07/03/23 0315)   potassium chloride (KLOR-CON) packet 40 mEq (40 mEq Oral Given 07/03/23 0901)       Final Impression:   Final diagnoses:   Suicidal ideation   Alcoholic intoxication without complication                                                                                                        Disclaimer:     The purpose of this note is to serve as a simplified record of care for the patient's extended emergency department course. Please refer to the initial provider note for a full history and physical exam.           Lamekia Nolden, Lynwood CHRISTELLA Raddle., MD  07/03/23 1304

## 2023-07-03 NOTE — Discharge Instructions (Addendum)
 BEHAVIORAL HEALTH DISCHARGE INSTRUCTIONS                  Immediately go to the nearest emergency department or call 911 if you have any thoughts of harming yourself or others,   or are having a mental health crisis            RESOURCES FOR OUTPATIENT PSYCHIATRIC FOLLOW-UP:    Programmer, applications (CSB)               [facility]  [email or web address]    24-hr CSB Emergency Services        [phone]   [facility] [phone]   Substance Abuse/Detox [address], [place], [place] [phone]   [facility], [place], [place] [phone]      [facility]   www.[email or web address].[email or web address]    24-hr CSB Emergency Services        [phone]   [facility] [place], [place]  [phone]      [facility]  [email or web address]://health.[email or web address] /[email or web address]-[email or web address]    24-hr CSB Emergency Services                                        [phone]   [facility] [place], [place]  [phone]         [facility]  [email or web address]   24-hr CSB Emergency Services [facility])  24-hr CSB Emergency Services [phone]  [phone]   [facility]  [place], [place]  [place], [place]  Substance Use Treatment   [phone]  [phone]  [phone]      [facility]  MaleWeight.co.nz   24-hr CSB Emergency Services          [phone]   [facility]  [place], [place] [phone]   [facility]  https://www.en[email or web address]  24-hr CSB Emergency Services  [facility] [address] [place], [place], [place], and [place]       [phone]  [phone]       [facility] Outpatient Services: For admissions and screening for all [facility],  including:  Comprehensive Addiction Treatment Services (CATS) Inpatient Detox, IOP Intensive Outpatient Programs  Partial Hospitalization Program (PHP), Outpatient psychiatry, Outpatient counseling    ArtificialBoobs.pl [phone]      [facility]: Adults, 18 years or older, in need of an urgent psychiatric assessment can contact our IPAC office,  Monday through Friday from 8 a.m. to 4 p.m. Please arrive no later than 2:45pm to be seen same day.    IPAC  [address], [place], [place] [address]  Hours: Monday through Friday from 8am - 4pm [phone]     Family Insight: Accepts all major insurances, including Medicaid and Medicare.   Mental Health Skill Building: This service is for adults and children in their late teens who suffer from mental illness and lack basic living skills, need connections to community resources and require help in managing their psychiatric symptoms.  Outpatient Services (Individual/Family Therapy)- offers individual, couple, family, and group counseling for individuals in a traditional outpatient setting. Our therapists provide therapy for children, adolescents and adults that struggle with managing a wide range of mental health, behavioral and/or substance use disorders. On-site and telehealth available.  Substance Use Disorder Treatment  Services- This program provides a continuum of assistance for adolescents, adults and families throughout their process of recovery.     https://familyinsight.net   [place]  [phone]  [phone]     Headway: Helps you find a therapist covered by your insurance (Aetna, Anthem [address] and [place], [place], [employer] and others)  https://headway.com     [facility]ma: Connects you with clinicians who provide therapy for individuals, couples, families and adolescents and medication management.  Most clinicians accept insurance  https://helloalma.com      [facility]: Providers and technology to treat common mental health disorders, accepts most insurance including Medicaid.  https://www.confidanthealth.com [phone]     PsychologyToday.Com: You can use this website to search for psychiatrists, therapists, support groups, and rehab programs that are close to you and take your insurance    https://www.psychologytoday.com/us /therapists       Mental Health Support Groups      NAMI National Alliance on Mental Illness  NAMI [place]   Address:           NAMI [place]   [address]  [place], [place] [address]  Phone:  [phone]  Email Address:  info@nami -bettye.org  Website:           http://www.nami-northernvirginia.org  Serving:            Welcome of [place], [place], [place]; Cities of [place], [place] and [employer]    Depression and Land.http://www.green.com/  DBSA of [employer]  provides free weekly mental health support groups.    The mission of the Depression and Bipolar Support Alliance, [place] [facility]  Chapter [facility]) is to provide hope, help, and support to improve the lives of people living with mental health challenges. DBSA pursues and accomplishes this mission through peer-based, recovery-oriented, empowering services and resources.    DBSA support groups meet virtually and in person weekly.  The groups are run by peers who assist, encourage and empower each other in helping themselves.  A DBSA support group is a safe space for people suffering from bipolar, depression or mood disorders to share personal experiences, feelings, and strategies for living successfully with these conditions.     Substance Abuse Services- [place]      [employer] CATS   [address]   [place], [place] [address]  [phone]    www.CardKnowledge.fi    [place] CSB   [address] [place], [place] [address]  [phone]    www.IdentityList.se       [facility])  [address],   [place], [place] [address]  [phone]    https://www.natcaptreatment.org    ASSESSMENT WALK-IN HOURS  Monday - Friday: 9:00 AM - 12:00 PM     [facility]  [address] [place], [place] [address]  [phone]    www.regionaladdiction.com  [facility]  [address]  [facility] [address]  Inpatient intake: [phone]  Outpatient: [phone]    www.PipeCollectors.no      [facility] Substance Use Services  [address]   [place], [place] [address]  [phone]    www.arlingtonva.us /Government/Programs/Health/Adult-Behavioral-Healthcare/Substance-Use-Adult     [facility]  [address] [place], [place] [address]  [phone]    www.methadonetreatmentcenters.[facility]  [address] Suite [place], [place] [address]  [phone]    https://www.freeman.biz/     Recovery Unplugged  [address]  [place], [address]  [phone]    www.recoveryunplugged.[facility] Recovery  [address]   [place], [place] [address]   [phone]    https://encorerecovery.com        [facility]  [address]  [place], [place] [address]  [phone]    www.insightactiontherapy.com     [facility]  [address],   [place], [place] [address]  [phone]    www.sandstonecare.com/     [facility]  Online only  [phone]    Services include addiction treatment, online Suboxone clinic, and recovery coaching.    www.confidanthealth.com     [facility]  Telehealth Treatment Program for Opioid Use Disorder    https://www.bicyclehealth.com     How to Get Help If you are  Experiencing Homelessness   How to Get Help If You Are Experiencing Homelessness  Call your local 2-1-1 or [phone].  Available 24/7/365 in 180 languages.  If you are at risk of or currently experiencing homelessness, the first step is to get in touch with the shelter system in your community. You may need to call a hotline or go to a community-designated organization for homeless services. Your community may have a "homeless hotline," "2-1-1," or other organization/agency that serves as the "front door" to receiving any kind of help.    If your community doesn't have a single access point, or you are unable to find it, you will have to identify various providers and determine if they have an available shelter bed or other resources like food and health care. A good place to start is your The Pepsi of ToysRus or Engineer, site, a nearby church, Audiological scientist, Honeywell, or a Programme researcher, broadcasting/film/video.    There are 3 shelters in the [place] area that accept adults:    [facility])   [address], [place], [place] [address]  [phone]; Fax [phone]   50-bed shelter for adults without children.   36 beds for men; 14 beds for women    [facility])  [address] [place], [place] [address]  [phone]; Fax [phone]   50-bed shelter for adults without children.   38 beds for men; 12 beds for women.    [facility])  [address] [place], [place] [address]  [phone]; Fax [phone]   A shelter for families with children and adults without children.   42 beds (10 rooms) for families.   28 beds for unaccompanied adults (20 men and 8 women).      [place] Homeless Drop-in Centers    Most of these are places where you can get food, use phone, and sometimes clothing.  A good portion of these have case managers to assist in navigating through community services that are  available.  Most areas use a centralized intake.  This means that you must call and get on wait list for more permanent housing.  In the meantime, the drop in shelters and cold weather shelters (when open) can provide temporary overnight stays while you work on more permanent housing.      [facility]  [address]  [place], [place] [address]  [phone]  Operated by [place]  Shelter  at their location  https://carpentersshelter.org  Monday - Saturday early mornings only: 7:00 AM -  9:00 AM  Offers showers, laundry, and referrals BILL [facility]  [employer] Building  [address]  [place], [place] [address]  [phone] (24/7)  Operated Drue Fallow [facility]  https://www.GamingOutlets.gl  ent/social-services/drop-center  Located off [address]  - Adults 18 and older Monday - Saturday 7 AM- 11:30 AM  Closed Sundays  Offers food, showers, phone, mail pick-up, and  referrals [place]  Day Program at [facility])  [address], [place] [address]  [phone]  Operated by [facility] Housing  https://www.newhopehousing.or  g  Across from  [facility] off  [address] and  [address] [place]  [place]  - Homeless adults 18  and older  - no children  Monday - Friday 9:00am - 4:00pm;  Offers meals, showers, and mail services   [place]  [facility] HOMELESS SERVICES  CENTER  [phone]  [place], [place] [address]  [phone] Drop-In Center  Information (24/7)  Weekdays 8:30 AM - 5 PM:  [phone] Information &  Referral/Coordinated Entry  TextRun.co.nz  www.shelterhouse.org  Near the [place]  airport  - Adults 18 and older  - Homeless  - Does not serve sex  offenders or  persons without  identification  Open 7 days a week 8:30 AM- 5 PM  Offers food, showers, laundry facilities, phone, and  case management.  shelters, and other services [facility]  [address] [place], [place] [address]  [phone] ext.  10  https://www.norman-romero.com/  nistries/hospitality-center-2  [employer]  - Men and women  18 and older  - 10 guests at a time  Monday, Tuesday, Wednesday, & Friday 10 AM-2 PM;  Thursday 3 PM - 5 PM  Offers bag lunch, showers, use of phones, mail pick-up,  emergency food and clothing; assistance with  access/referrals to SSI/SSDI services; and to mental  health case management and social services [facility] IN RECOVERY  [address] [place], [place] [address]  [phone]  Operated by Recovery Program  Solutions of [place].  www.MobileCycles.pl  1 block east of  [address]  Enter through the  side door.  - Adults 18 and older  with mental health  & substance use  disorders  - Homeless  - May limit the  number of guests  at any one time  Monday - Friday 10 AM - 2 PM  Offers certified peer support, lunch, computer skills,  and support groups open to all in need.   [facility]  DROP-IN CENTER [facility])  [address],  [place], [place] [address]  [phone]  Operated by Recovery Program  Solutions of [place].  www.MobileCycles.pl  Off [address] at [facility]  - Adults 18 and older  with mental health  & substance use  disorders  - Homeless    Monday & Friday 3 PM-7 PM;  Saturday & Sunday 12 noon-7 PM  Offers certified peer support, snacks, computer lab,  telephone access, employment assistance, in-person  and virtual support groups open to all in need. Also  offers showers, laundry, donated clothing. Hot lunch  Mon. & Fri., hot dinner at 5 PM weekends, and brunch  on Sundays are available.  [facility] Planning (CSP)  [phone] weekdays 8:00 AM--4:30 PM   SAFE HAVEN  [address]  [address] [place], [place] [address]  [phone]  Email: safehaven@fccfc .org  1st [place]  (between [address]  and [employer])  - Homeless and  others lacking  sufficient food  Thursday 12:00 noon - 1 PM  Drop-in  Center is closed until further notice. Use the  back parking lot to pick up a bag with breakfast and  lunch; occasionally offers groceries, coats, etc. as  donated by community service groups.   [facility]  2020-A [address] [place], [place]  [phone]  Operated by Lynna Don  (formerly A-SPAN)  https://www.pathforwardva.org  Located near the  [employer]  stop.  - Homeless adults 20  and older  - no children  Monday - Friday 9:00 AM - 4:00 PM  Offers meals, showers, and laundry facilities, as well as  case management, employment assistance, life skills  training, benefit enrollment; mental health and  substance abuse treatment, and medical care.   MERRIFIELD PEER [facility]  [address]  [place], [place] [address]  [phone]  Operated by Recovery Program  Solutions of [place].  www.MobileCycles.pl  Located in the  [employer]  CSB building. Take  [address] exit for  [address], then  first left on [address]  - Adults 18 and older  with mental health  & substance use  disorders  - Homeless  Monday- Friday 10 AM - 4 PM  Offers certified peer support, snacks, clothing closet,  telephone access, computer skills, employment  assistance, in-person and virtual support groups open  to all in need. Limited capacity. Call ahead.   [facility]  [address]  [place], [place] [address]  [phone]  Operated by Recovery Program  Solutions of [place].  www.MobileCycles.pl  Located in the  [facility]  - Adults 18 and older  with mental health  & substance use  disorders  - Homeless  Monday- Friday 9 AM - 2:30PM  Offers certified peer support; breakfast, hot lunch and  snacks; coats; computer skills; employment assistance;  and in-person and virtual support groups open to all in  need. Limited capacity. Call ahead. [facility]  [address]  [place],  [place] [address]  [phone] (evening meals  messages)  Operated by [employer]  GroupBirthday.com.ee  [employer] Foods & [address] 4  blocks south of [place]. Metro  - No restrictions Open 365 days a year (including holidays)  Arrive 4:30 - 5:30 PM to sign up and line up for a meal.  Meals are served and eaten outside or may be taken  off-site. 2-3 days each week have sandwich meals, 4-5  days have hot meals. The number of days with hot  meals will increase as volunteer cooks return.   [facility]  [address]  [place], [place] [address]  [phone]  www.thelambcenter.org  West of [place]  in [place], 2nd  road on right [place])  off [address]  - Must be homeless  - Adults 18 and older  Monday- Friday 8 AM - 3 PM  Saturday 8 -12:30 PM  Offers meals, showers, laundry drop off or pick up,  telephone message and mail service, clothing, support  groups, and case management counseling [facility]  (In multipurpose room)  [address]  [place], [place]  [phone]  Operated by LYNNA - Project  to  Assist Transition from  Homelessness  1 block [address] of  [place]. [facility] in the  [facility],  formerly the [place].  [facility] Mental  Health Center  - Adults 87 and older  - Homeless and not  in a shelter  Tuesday 12 noon-4:00 PM  Friday 8:00 AM-12 noon  Offers snacks, showers, PO box, laundry, clothing, and  referrals to many services. Encourages individuals to  seek mental health and substance abuse counseling,  and health care. [facility]  [facility]  [address]  [place], [place] [address]  [phone]  Operated by Recovery Program  Solutions of [place].  www.MobileCycles.pl  Just inside the  [place]elt[place], across  from [employer]'s  - Adults 18 and older  with mental health  & substance use  disorders  - Homeless  Monday - Thursday 10 AM-4PM  Offers certified peer support, lunch, computer skills,  one in-person support group each week and daily  virtual support  groups open to all in need. Music group  meets 11 AM Mondays; art group meets 11 AM  Tuesdays, and a women's group meets Tuesdays at 1  PM.   [facility])  [address]  [place], [place]  [phone]  Operated by Cornerstones  www.cornerstonesva.org  [facility]  and [facility]  - Adults 18 and older Tuesday and Thursday 10:00 AM-3:00 PM  Saturday and Sunday 7:00 AM-1:00 PM  Showers, laundry, breakfast, and, on Tuesday and  Thursday hot lunch. Bag lunches are available 24 hours,  7 days a week. CULPEPER  MANNA MINISTRY  [facility]  [address]  [place], [place] [address]  [phone] Information  [phone] [facility] office  http://stephenson-sullivan.com/  geralyn  Curbside pickup  only from [place]  entrance  - anyone in need Monday, Wednesday, & Friday 11:00 AM to 12:30 PM  Provides hot meals in take-out containers;  occasionally offers fresh produce and other groceries  as donations are available AUNDRA (continued)  Day Program at [facility])  [address] [place], [place] [address]  [phone]  Operated by [facility] Housing  https://www.newhopehousing.or  g  Across from  [facility] off  [address] and  [address] [place]  [place]  - Homeless adults 18  and older  - no children  Monday - Friday 9:00am - 4:00pm;  Offers meals, showers, and mail services       Accessing Food in Your Community    The Supplemental Nutrition Assistance Program (SNAP) offers nutrition assistance to millions of eligible, low-income individuals and families.   Find or call your local SNAP office  Call the national information line [phone]    Most drop in shelters provide food, and you can also call the food banks to get a list of local places to receive food.    [employer]  [address]  [address]  [place], [place] [address]  [phone]    [facility] Food [facility]  [address] [place]   , [place]  [address]  [phone]    Groceries  [place], [employer].  (HuntLaws.ca)  [phone]. Call or visit website to arrange food assistance at any time. Serves [employer].    Bridge Feeds  (MissedFlights.com.br)  [phone]. Call or text to register for a food box pick up - located in [place].    [employer]  (https://www.ccda.net/need-help/food/food-pantries/Stevens-regional-food-pantry-(sterling)-appointment-information/)  [phone]. Serving all of [place]  by appointment, visit website to schedule; picture ID and proof of address required* Architect Foods Available).    Crossroads [employer]cker  [phone]. Located in [place].    Dulles [employer]  (http://stevens.info/)  [phone]. Serves residents of [employer] by appointment-call or email to schedule; picture ID and proof of address required.    Hands of Compassion Borders [employer] Life Church)  [phone]. Monthly pantry 1st Saturday of the month. Located in [place].    Insurance risk surveyor  (http://www.linkagainsthunger.org/)  [phone]. Serves residents of [place], [place], and [employer].    [address] Hunger Relief  (https://www.loudounhunger.org/)  (782)360-1782. All by appointment, visit website to schedule; proof of [facility] residency and picture ID required*. Architect Foods Available).    [facility] Hope  (https://mobile-hope.org/)  [phone]. Programs for homeless & at-risk youth. Food support available on campus. Delivery available through bus routes.  Check website or call for additional details.    [place]  (BeverageBargains.co.za)  [phone]. Call for appointment. Drive through food pantry available on the fourth Saturday of the month. Architect Foods Available). Located in [place].    [employer]Health  (https://www.weddingcandid.com)  O9961633.    [facility] Food Pantry  (SixMonthFoodSupply.at)  [phone]. By  appointment.    Seven Loaves Services  (TripMetro.co.za)  [phone]. Registration required at first visit. Architect Foods Available).    Sterling [employer] Pantry  (TypoPro.hu)  Architect Foods Available).    Sterling Black & Decker [employer])  (CardsOnFile.ch)  [phone].    The [employer]  (https://burnettwilliams.com/the-[place]-county-ampersand-food-pantry-project/)  Little Free Pantry Box - located at [employer] in [place].    [facility]ries  (https://www.tolministries.org/)
